# Patient Record
Sex: Female | Born: 1970 | Race: Black or African American | Hispanic: No | Smoking: Never smoker
Health system: Southern US, Community
[De-identification: ages and names within clinical notes are randomized; demographics above are authoritative.]

## PROBLEM LIST (undated history)

## (undated) DIAGNOSIS — F419 Anxiety disorder, unspecified: Secondary | ICD-10-CM

## (undated) DIAGNOSIS — N920 Excessive and frequent menstruation with regular cycle: Secondary | ICD-10-CM

## (undated) DIAGNOSIS — R319 Hematuria, unspecified: Secondary | ICD-10-CM

## (undated) DIAGNOSIS — I341 Nonrheumatic mitral (valve) prolapse: Secondary | ICD-10-CM

## (undated) DIAGNOSIS — R300 Dysuria: Secondary | ICD-10-CM

## (undated) DIAGNOSIS — B9689 Other specified bacterial agents as the cause of diseases classified elsewhere: Secondary | ICD-10-CM

## (undated) DIAGNOSIS — M329 Systemic lupus erythematosus, unspecified: Secondary | ICD-10-CM

## (undated) DIAGNOSIS — N63 Unspecified lump in unspecified breast: Secondary | ICD-10-CM

## (undated) DIAGNOSIS — N760 Acute vaginitis: Secondary | ICD-10-CM

## (undated) DIAGNOSIS — N644 Mastodynia: Secondary | ICD-10-CM

## (undated) DIAGNOSIS — N390 Urinary tract infection, site not specified: Secondary | ICD-10-CM

## (undated) DIAGNOSIS — R102 Pelvic and perineal pain: Secondary | ICD-10-CM

## (undated) DIAGNOSIS — K5900 Constipation, unspecified: Secondary | ICD-10-CM

## (undated) DIAGNOSIS — J189 Pneumonia, unspecified organism: Secondary | ICD-10-CM

## (undated) DIAGNOSIS — N930 Postcoital and contact bleeding: Secondary | ICD-10-CM

## (undated) DIAGNOSIS — N809 Endometriosis, unspecified: Secondary | ICD-10-CM

## (undated) DIAGNOSIS — K219 Gastro-esophageal reflux disease without esophagitis: Secondary | ICD-10-CM

## (undated) DIAGNOSIS — D649 Anemia, unspecified: Secondary | ICD-10-CM

## (undated) DIAGNOSIS — I7300 Raynaud's syndrome without gangrene: Secondary | ICD-10-CM

## (undated) DIAGNOSIS — IMO0002 Reserved for concepts with insufficient information to code with codable children: Secondary | ICD-10-CM

## (undated) DIAGNOSIS — F32A Depression, unspecified: Secondary | ICD-10-CM

## (undated) DIAGNOSIS — N840 Polyp of corpus uteri: Secondary | ICD-10-CM

## (undated) DIAGNOSIS — N83209 Unspecified ovarian cyst, unspecified side: Secondary | ICD-10-CM

## (undated) DIAGNOSIS — D219 Benign neoplasm of connective and other soft tissue, unspecified: Secondary | ICD-10-CM

## (undated) DIAGNOSIS — K625 Hemorrhage of anus and rectum: Secondary | ICD-10-CM

## (undated) DIAGNOSIS — M349 Systemic sclerosis, unspecified: Secondary | ICD-10-CM

## (undated) DIAGNOSIS — N926 Irregular menstruation, unspecified: Secondary | ICD-10-CM

## (undated) DIAGNOSIS — J849 Interstitial pulmonary disease, unspecified: Secondary | ICD-10-CM

## (undated) DIAGNOSIS — Z460 Encounter for fitting and adjustment of spectacles and contact lenses: Secondary | ICD-10-CM

## (undated) DIAGNOSIS — Z8489 Family history of other specified conditions: Secondary | ICD-10-CM

## (undated) DIAGNOSIS — I10 Essential (primary) hypertension: Secondary | ICD-10-CM

## (undated) DIAGNOSIS — M351 Other overlap syndromes: Secondary | ICD-10-CM

## (undated) HISTORY — DX: Dysuria: R30.0

## (undated) HISTORY — DX: Reserved for concepts with insufficient information to code with codable children: IMO0002

## (undated) HISTORY — DX: Systemic lupus erythematosus, unspecified: M32.9

## (undated) HISTORY — DX: Other specified bacterial agents as the cause of diseases classified elsewhere: N76.0

## (undated) HISTORY — DX: Polyp of corpus uteri: N84.0

## (undated) HISTORY — PX: FOOT BONE EXCISION: SUR493

## (undated) HISTORY — DX: Excessive and frequent menstruation with regular cycle: N92.0

## (undated) HISTORY — DX: Endometriosis, unspecified: N80.9

## (undated) HISTORY — DX: Benign neoplasm of connective and other soft tissue, unspecified: D21.9

## (undated) HISTORY — PX: STRABISMUS SURGERY: SHX218

## (undated) HISTORY — DX: Other specified bacterial agents as the cause of diseases classified elsewhere: B96.89

## (undated) HISTORY — DX: Unspecified ovarian cyst, unspecified side: N83.209

## (undated) HISTORY — DX: Postcoital and contact bleeding: N93.0

## (undated) HISTORY — DX: Hemorrhage of anus and rectum: K62.5

## (undated) HISTORY — DX: Urinary tract infection, site not specified: N39.0

## (undated) HISTORY — DX: Essential (primary) hypertension: I10

## (undated) HISTORY — DX: Pelvic and perineal pain: R10.2

## (undated) HISTORY — DX: Gastro-esophageal reflux disease without esophagitis: K21.9

## (undated) HISTORY — DX: Mastodynia: N64.4

## (undated) HISTORY — PX: TONSILECTOMY, ADENOIDECTOMY, BILATERAL MYRINGOTOMY AND TUBES: SHX2538

## (undated) HISTORY — DX: Constipation, unspecified: K59.00

## (undated) HISTORY — DX: Unspecified lump in unspecified breast: N63.0

## (undated) HISTORY — PX: BREAST BIOPSY: SHX20

## (undated) HISTORY — DX: Irregular menstruation, unspecified: N92.6

## (undated) HISTORY — DX: Hematuria, unspecified: R31.9

---
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
## 1998-10-27 ENCOUNTER — Other Ambulatory Visit: Admission: RE | Admit: 1998-10-27 | Discharge: 1998-10-27 | Payer: Self-pay | Admitting: *Deleted

## 1999-02-26 ENCOUNTER — Emergency Department (HOSPITAL_COMMUNITY): Admission: EM | Admit: 1999-02-26 | Discharge: 1999-02-26 | Payer: Self-pay | Admitting: Emergency Medicine

## 1999-10-03 ENCOUNTER — Other Ambulatory Visit: Admission: RE | Admit: 1999-10-03 | Discharge: 1999-10-03 | Payer: Self-pay | Admitting: *Deleted

## 1999-10-13 ENCOUNTER — Ambulatory Visit (HOSPITAL_COMMUNITY): Admission: RE | Admit: 1999-10-13 | Discharge: 1999-10-13 | Payer: Self-pay | Admitting: *Deleted

## 1999-10-13 ENCOUNTER — Encounter: Payer: Self-pay | Admitting: *Deleted

## 2000-10-02 ENCOUNTER — Other Ambulatory Visit: Admission: RE | Admit: 2000-10-02 | Discharge: 2000-10-02 | Payer: Self-pay | Admitting: Obstetrics and Gynecology

## 2000-10-03 ENCOUNTER — Encounter: Payer: Self-pay | Admitting: Obstetrics and Gynecology

## 2000-10-03 ENCOUNTER — Encounter: Admission: RE | Admit: 2000-10-03 | Discharge: 2000-10-03 | Payer: Self-pay | Admitting: Obstetrics and Gynecology

## 2001-09-18 HISTORY — PX: DIAGNOSTIC LAPAROSCOPY: SUR761

## 2001-10-07 ENCOUNTER — Other Ambulatory Visit: Admission: RE | Admit: 2001-10-07 | Discharge: 2001-10-07 | Payer: Self-pay | Admitting: Obstetrics and Gynecology

## 2002-05-09 ENCOUNTER — Encounter (INDEPENDENT_AMBULATORY_CARE_PROVIDER_SITE_OTHER): Payer: Self-pay

## 2002-05-09 ENCOUNTER — Ambulatory Visit (HOSPITAL_COMMUNITY): Admission: RE | Admit: 2002-05-09 | Discharge: 2002-05-09 | Payer: Self-pay | Admitting: Obstetrics and Gynecology

## 2002-10-08 ENCOUNTER — Other Ambulatory Visit: Admission: RE | Admit: 2002-10-08 | Discharge: 2002-10-08 | Payer: Self-pay | Admitting: Obstetrics and Gynecology

## 2002-11-17 ENCOUNTER — Encounter (INDEPENDENT_AMBULATORY_CARE_PROVIDER_SITE_OTHER): Payer: Self-pay | Admitting: Specialist

## 2002-11-17 ENCOUNTER — Ambulatory Visit (HOSPITAL_COMMUNITY): Admission: RE | Admit: 2002-11-17 | Discharge: 2002-11-17 | Payer: Self-pay | Admitting: Gastroenterology

## 2003-01-26 ENCOUNTER — Encounter: Payer: Self-pay | Admitting: Obstetrics and Gynecology

## 2003-01-26 ENCOUNTER — Encounter: Admission: RE | Admit: 2003-01-26 | Discharge: 2003-01-26 | Payer: Self-pay | Admitting: Obstetrics and Gynecology

## 2003-10-19 ENCOUNTER — Other Ambulatory Visit: Admission: RE | Admit: 2003-10-19 | Discharge: 2003-10-19 | Payer: Self-pay | Admitting: Obstetrics and Gynecology

## 2004-04-26 ENCOUNTER — Encounter: Admission: RE | Admit: 2004-04-26 | Discharge: 2004-04-26 | Payer: Self-pay | Admitting: Gastroenterology

## 2004-08-29 ENCOUNTER — Encounter: Admission: RE | Admit: 2004-08-29 | Discharge: 2004-08-29 | Payer: Self-pay | Admitting: Obstetrics and Gynecology

## 2004-11-29 ENCOUNTER — Other Ambulatory Visit: Admission: RE | Admit: 2004-11-29 | Discharge: 2004-11-29 | Payer: Self-pay | Admitting: Obstetrics and Gynecology

## 2005-09-20 ENCOUNTER — Encounter: Admission: RE | Admit: 2005-09-20 | Discharge: 2005-09-20 | Payer: Self-pay | Admitting: Obstetrics and Gynecology

## 2005-12-13 ENCOUNTER — Other Ambulatory Visit: Admission: RE | Admit: 2005-12-13 | Discharge: 2005-12-13 | Payer: Self-pay | Admitting: Obstetrics and Gynecology

## 2006-07-10 ENCOUNTER — Encounter: Admission: RE | Admit: 2006-07-10 | Discharge: 2006-07-10 | Payer: Self-pay | Admitting: Obstetrics and Gynecology

## 2006-10-02 ENCOUNTER — Encounter: Admission: RE | Admit: 2006-10-02 | Discharge: 2006-10-02 | Payer: Self-pay | Admitting: Obstetrics and Gynecology

## 2007-07-26 ENCOUNTER — Emergency Department (HOSPITAL_COMMUNITY): Admission: EM | Admit: 2007-07-26 | Discharge: 2007-07-26 | Payer: Self-pay | Admitting: Emergency Medicine

## 2007-10-14 ENCOUNTER — Encounter: Admission: RE | Admit: 2007-10-14 | Discharge: 2007-10-14 | Payer: Self-pay | Admitting: Obstetrics and Gynecology

## 2008-10-26 ENCOUNTER — Encounter: Admission: RE | Admit: 2008-10-26 | Discharge: 2008-10-26 | Payer: Self-pay | Admitting: Obstetrics and Gynecology

## 2008-12-24 ENCOUNTER — Emergency Department (HOSPITAL_BASED_OUTPATIENT_CLINIC_OR_DEPARTMENT_OTHER): Admission: EM | Admit: 2008-12-24 | Discharge: 2008-12-24 | Payer: Self-pay | Admitting: Emergency Medicine

## 2009-11-16 ENCOUNTER — Encounter: Admission: RE | Admit: 2009-11-16 | Discharge: 2009-11-16 | Payer: Self-pay | Admitting: Obstetrics and Gynecology

## 2009-12-01 ENCOUNTER — Encounter: Admission: RE | Admit: 2009-12-01 | Discharge: 2009-12-01 | Payer: Self-pay | Admitting: Obstetrics and Gynecology

## 2010-09-18 DIAGNOSIS — M329 Systemic lupus erythematosus, unspecified: Secondary | ICD-10-CM

## 2010-09-18 HISTORY — DX: Systemic lupus erythematosus, unspecified: M32.9

## 2010-10-09 ENCOUNTER — Encounter: Payer: Self-pay | Admitting: Obstetrics and Gynecology

## 2010-12-15 ENCOUNTER — Other Ambulatory Visit: Payer: Self-pay | Admitting: Obstetrics and Gynecology

## 2010-12-15 DIAGNOSIS — Z1231 Encounter for screening mammogram for malignant neoplasm of breast: Secondary | ICD-10-CM

## 2010-12-20 ENCOUNTER — Ambulatory Visit
Admission: RE | Admit: 2010-12-20 | Discharge: 2010-12-20 | Disposition: A | Discharge status: Home / Self Care | Payer: BC Managed Care – PPO | Source: Ambulatory Visit | Attending: Obstetrics and Gynecology | Admitting: Obstetrics and Gynecology

## 2010-12-20 DIAGNOSIS — Z1231 Encounter for screening mammogram for malignant neoplasm of breast: Secondary | ICD-10-CM

## 2011-02-03 NOTE — Op Note (Signed)
NAME:  Sherry Campos, Sherry Campos                ACCOUNT NO.:  [id number]   MEDICAL RECORD NO.:  [id number]                   PATIENT TYPE:  AMB   LOCATION:  SDC                                  FACILITY:  [facility]   PHYSICIAN:  Janine Limbo, M.D.            DATE OF BIRTH:  05/04/1971   DATE OF PROCEDURE:  05/09/2002  DATE OF DISCHARGE:                                 OPERATIVE REPORT   PREOPERATIVE DIAGNOSES:  1. Pelvic pain.  2. Dyspareunia.  3. Rectal pain.   POSTOPERATIVE DIAGNOSES:  1. Pelvic pain.  2. Dyspareunia.  3. Rectal pain.  4. Pelvic adhesive disease (severe).  5. Fibroid uterus.   PROCEDURE:  1. Diagnostic laparoscopy.  2. Laparoscopic lysis of adhesions.  3. Proctoscopy.   SURGEON:  Janine Limbo, M.D.   ANESTHESIA:  General.   INDICATIONS:  The patient is a 40 year old female, para 1-0-1-1, who  presents with the above mentioned diagnoses.  She understands the  indications for her procedure and she accepts the risks of, but not limited  to, anesthetic complications, bleeding, infection and the possible damage to  the surrounding organs.  She understands that no guarantees can be given  concerning the total relief of her discomfort.   FINDINGS:  The uterus was normal size.  There was a 1 cm fibroid present on  the posterior fundus of the uterus.  There were dense adhesions present in  the posterior cul-de-sac that practically obliterated the space.  There were  dense adhesions between the ovaries, the posterior cul-de-sac and the bowel.  Surprisingly, the ovaries were normal except for the adhesions.  The  fallopian tubes were involved in the adhesions.  The fimbriated ends of the  fallopian tubes were delicate nevertheless.  There was no evidence of  endometriosis in the posterior cul-de-sac, anterior cul-de-sac, along the  bowel or along the pelvic sidewalls.  There were two to three inclusion  cysts with relatively thick walls present in the  right posterior cul-de-sac.  The omentum was adhered to the anterior abdominal wall in the lower pelvis.  There was no evidence of damage to the bowel when we performed the  proctoscopy.  The appendix, the liver and the upper abdomen appeared normal.   DESCRIPTION OF PROCEDURE:  The patient was taken to the operating room where  a general anesthetic was given.  The patient's abdomen, perineum and vagina  were prepped with multiple layers of Betadine.  Examination under anesthesia  was performed.  A Foley catheter was placed in the bladder.  A Hulka  tenaculum was placed inside the uterus.  The patient was sterilely draped.  The subumbilical area was injected with 4 cc of 0.25% Marcaine with  epinephrine.  A subumbilical incision was made, and the Veress needle was  inserted without difficulty.  Proper placement was confirmed using the  saline drop test.  A pneumoperitoneum was obtained.  The laparoscopic trocar  and then the laparoscope were substituted  for the Veress needle.  The pelvic  structures were visualized with findings as mentioned above.  Pictures were  taken of the patient's pelvic anatomy.  Two suprapubic areas were injected  with a total of 6 cc of 0.25% Marcaine with epinephrine.  Two incisions were  made, and two 5 mm trocars were placed in the lower abdomen under direct  visualization.  The adhesions between the omentum and the anterior abdominal  wall was then cauterized and then cut.  The adhesions in the posterior cul-  de-sac were then lysed using a combination of sharp dissection, blunt  dissection, and hydrodissection.  This took several hours because of the  dense nature of the adhesions.  We were able to isolate the ovaries  bilaterally, and the adhesions were lysed as we mentioned previously.  Two  to three inclusion cysts were noted in the right posterior cul-de-sac, and  these were removed using sharp dissection and blunt dissection.  Care was  taken not to  damage the bowel and not to damage the vital structures of the  pelvic sidewall.  Once the adhesions were completely lysed as best we could,  then the pelvis was vigorously irrigated.  Hemostasis was noted to be  adequate.  Saline was placed in the cul-de-sac.  Proctoscopy was then  performed.   Air was placed in the rectum and the lower bowel, and there was no evidence  of leakage.  The air was allowed to escape from the lower bowel and the  rectum.  The operator then changed the scrub outfit.  A final check was made  in the pelvis for hemostasis.  Hemostasis again was noted to be adequate.  The bowel was carefully inspected, and there was no evidence of damage from  our trocars.  Excess fluid was aspirated from the pelvis.  The instruments  were removed.  The incisions were closed after the gas was allowed to  escape.  Suture material used was 4-0 Vicryl.  Sponge, needle and instrument  counts were correct on two occasions.  The estimated blood loss was 20 cc.  The patient tolerated her procedure well.  The patient was awakened from her  anesthetic and taken to the recovery room in stable condition.  The  specimens were sent to pathology.  Pictures were taken of the patient's  pelvic and abdominal anatomy.   FOLLOW UP INSTRUCTIONS:  The patient will return to see Dr. Stefano Gaul in two  to three weeks for followup examination.  She was given a copy of the  postoperative instruction sheet as prepared by the [facility] of  [facility] for patients who have undergone laparoscopy.  She was given a  prescription for Tylenol No. 3, and she will taken one to two tablets every  four hours as needed for pain.                                                Janine Limbo, M.D.    AVS/MEDQ  D:  05/09/2002  T:  05/10/2002  Job:  [id number]
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
## 2011-02-03 NOTE — Op Note (Signed)
NAME:  Sherry Campos, Sherry Campos                ACCOUNT NO.:  [id number]   MEDICAL RECORD NO.:  [id number]                   PATIENT TYPE:  AMB   LOCATION:  ENDO                                 FACILITY:  [facility]   PHYSICIAN:  Anselmo Rod, M.D.               DATE OF BIRTH:  1970/09/24   DATE OF PROCEDURE:  11/17/2002  DATE OF DISCHARGE:                                 OPERATIVE REPORT   PROCEDURE:  Colonoscopy with biopsies.   ENDOSCOPIST:  Anselmo Rod, M.D.   INSTRUMENT USED:  Olympus video colonoscope.   INDICATIONS FOR PROCEDURE:  This is a 40 year old African-American female  with a history of rectal bleeding, a family history of colon cancer in an  uncle, and breast cancer in her mother.  Rule out colonic polyps, masses,  etc.  Rectal bleeding.   PRE-PROCEDURE PREPARATION:  An informed consent was procured from the  patient and the patient was fasted for eight hours prior to the procedure  and prepped with a bottle of magnesium citrate and a gallon of GoLYTELY on  the night prior to the procedure.   PRE-PROCEDURE PHYSICAL EXAM:  VITAL SIGNS:  Stable.  NECK:  Supple.  CHEST:  Clear to auscultation.  HEART:  S1, S2, regular.  ABDOMEN:  Soft with normal bowel sounds.   DESCRIPTION OF PROCEDURE:  The patient was placed in the left lateral  decubitus position and sedated with 100 mg of Demerol and 10 mg of Versed  intravenously.  Once the patient was adequately sedated and maintained on  low-flow oxygen and continuous cardiac monitoring, the Olympus video  colonoscope was advanced from the rectum to the cecum and terminal ileum  without difficulty.  The patient had a fairly good prep.  There were small  external hemorrhoids on anal inspection.  A small area of erythematous  nodular lesion was seen at 18 cm.  This was biopsied for pathology to rule  out dysplasia versus adenoma.  The rest of the colon beyond this point, up  to the terminal ileum appeared normal.    IMPRESSION:  1. Small external hemorrhoids.  2. Small nodular lesion, biopsied at 18 cm.  Rule out  dysplasia versus     adenoma.  3. There was normal-appearing proximal left colon, right colon, transverse     colon, cecum and terminal ileum.    RECOMMENDATIONS:  1. Await the pathology results.  2. Outpatient followup in the next two weeks for recommendations.                                               Anselmo Rod, M.D.    JNM/MEDQ  D:  11/17/2002  T:  11/17/2002  Job:  [id number]   cc:   Janine Limbo, M.D.  [address], [address]  [place]  [place] [address]  Fax: [phone]

## 2011-06-27 LAB — DIFFERENTIAL
Basophils Relative: 0
Eosinophils Absolute: 0.1
Monocytes Relative: 6
Neutro Abs: 8.1 — ABNORMAL HIGH
Neutrophils Relative %: 80 — ABNORMAL HIGH

## 2011-06-27 LAB — COMPREHENSIVE METABOLIC PANEL
ALT: 12
AST: 20
BUN: 8
Creatinine, Ser: 0.63
Glucose, Bld: 122 — ABNORMAL HIGH
Potassium: 3.9
Sodium: 135

## 2011-06-27 LAB — URINALYSIS, ROUTINE W REFLEX MICROSCOPIC: Urobilinogen, UA: 0.2

## 2011-06-27 LAB — CBC: HCT: 32.7 — ABNORMAL LOW

## 2011-06-27 LAB — URINE MICROSCOPIC-ADD ON

## 2011-07-17 ENCOUNTER — Other Ambulatory Visit: Payer: Self-pay | Admitting: Obstetrics and Gynecology

## 2011-07-17 DIAGNOSIS — N644 Mastodynia: Secondary | ICD-10-CM

## 2011-07-17 DIAGNOSIS — N63 Unspecified lump in unspecified breast: Secondary | ICD-10-CM

## 2011-07-27 ENCOUNTER — Ambulatory Visit
Admission: RE | Admit: 2011-07-27 | Discharge: 2011-07-27 | Disposition: A | Discharge status: Home / Self Care | Payer: BC Managed Care – PPO | Source: Ambulatory Visit | Attending: Obstetrics and Gynecology | Admitting: Obstetrics and Gynecology

## 2011-07-27 DIAGNOSIS — N63 Unspecified lump in unspecified breast: Secondary | ICD-10-CM

## 2011-07-27 DIAGNOSIS — N644 Mastodynia: Secondary | ICD-10-CM

## 2012-05-09 ENCOUNTER — Ambulatory Visit: Payer: Self-pay | Admitting: Obstetrics and Gynecology

## 2012-06-03 ENCOUNTER — Ambulatory Visit: Payer: Self-pay | Admitting: Obstetrics and Gynecology

## 2012-06-26 ENCOUNTER — Ambulatory Visit: Payer: BC Managed Care – PPO | Admitting: Obstetrics and Gynecology

## 2012-07-04 ENCOUNTER — Ambulatory Visit (INDEPENDENT_AMBULATORY_CARE_PROVIDER_SITE_OTHER): Payer: BC Managed Care – PPO | Admitting: Obstetrics and Gynecology

## 2012-07-04 ENCOUNTER — Encounter: Payer: Self-pay | Admitting: Obstetrics and Gynecology

## 2012-07-04 VITALS — BP 110/70 | HR 80 | Resp 16 | Ht 65.5 in | Wt 176.0 lb

## 2012-07-04 DIAGNOSIS — Z01419 Encounter for gynecological examination (general) (routine) without abnormal findings: Secondary | ICD-10-CM

## 2012-07-04 DIAGNOSIS — D259 Leiomyoma of uterus, unspecified: Secondary | ICD-10-CM

## 2012-07-04 DIAGNOSIS — N6314 Unspecified lump in the right breast, lower inner quadrant: Secondary | ICD-10-CM

## 2012-07-04 DIAGNOSIS — Z124 Encounter for screening for malignant neoplasm of cervix: Secondary | ICD-10-CM

## 2012-07-04 DIAGNOSIS — M329 Systemic lupus erythematosus, unspecified: Secondary | ICD-10-CM

## 2012-07-04 DIAGNOSIS — N63 Unspecified lump in unspecified breast: Secondary | ICD-10-CM

## 2012-07-04 DIAGNOSIS — D219 Benign neoplasm of connective and other soft tissue, unspecified: Secondary | ICD-10-CM

## 2012-07-04 NOTE — Progress Notes (Signed)
Subjective:    Sherry Campos is a 41 y.o. female, G2P1, who presents for an annual exam. The patient reports new diagnosis of Lupus last fall and evaluation of right breast nodule (U/S and diagnostic MG) in early spring. States the SLE has been responsive, thus far to treatment and her breast nodule changes sizes and tenderness depending on phase of her menstrual cycle.  Menstrual cycle:   LMP: Patient's last menstrual period was 06/21/2012. Flow 6 days with pad change every 3-4 hours, minimal cramps  PMH: new diagnosis-SLE (Rheumatologist-Dr. Christen Bame,  [facility]            Review of Systems Pertinent items are noted in HPI. Denies pelvic pain, urinary tract symptoms, vaginitis symptoms, irregular bleeding, menopausal symptoms, or rectal bleeding Patient does admit to constipation Objective:    BP 110/70  Pulse 80  Resp 16  Ht 5' 5.5" (1.664 m)  Wt 176 lb (79.833 kg)  BMI 28.84 kg/m2  LMP 06/21/2012    Wt Readings from Last 1 Encounters:  07/04/12 176 lb (79.833 kg)   Body mass index is 28.84 kg/(m^2). General Appearance: Alert, no acute distress HEENT: Grossly normal Neck / Thyroid: Supple, no thyromegaly or cervical adenopathy Lungs: Clear to auscultation bilaterally Back: No CVA tenderness Breast Exam: Right breast with 1.5 cm cystic, slightly tender, slightly mobile nodule at 5 o'clock of areola, No dimpling, nipple retraction or discharge. Cardiovascular: Regular rate and rhythm.  Gastrointestinal: Soft, non-tender, no masses or organomegaly Pelvic Exam: EGBUS-wnl, vagina-normal rugae, cervix- without lesions or tenderness, uterus appears 12-14 weeks size, adnexae-no masses or tenderness Rectovaginal: no masses and normal sphincter tone Lymphatic Exam: Non-palpable nodes in neck, clavicular,  axillary, or inguinal regions  Skin: no rashes or abnormalities Extremities: no clubbing cyanosis or edema  Neurologic: grossly normal Psychiatric: Alert and  oriented   Assessment:   Routine GYN Exam SLE  Family History (breast cancer-mother/maternal aunts) Right Breast Nodule x 9 months Fibroids (stable) Constipation    Plan:  Offered general surgery consultation for breast nodule (has seen a general surgeon with no recommendations for excision) Offered genetic counseling-declines for now  Bowel Hygiene,  increase liquids, stool softeners and/or Miralax if laxative is needed PAP sent  RTO 1 year or prn  Kiowa Hollar,ELMIRAPA-C

## 2012-07-04 NOTE — Progress Notes (Signed)
Regular Periods: yes Mammogram: yes  Monthly Breast Ex.: yes Exercise: yes  Tetanus < 10 years: yes Seatbelts: yes  NI. Bladder Functn.: yes Abuse at home: no  Daily BM's: no Constipated Stressful Work: yes  Healthy Diet: yes Sigmoid-Colonoscopy: "2005" WNL  Calcium: no Medical problems this year: Dx w/ Lupus in jan. 2013   LAST PAP:02/17/2009  Contraception: VAS  Mammogram:  11/2011 Toma Aran  PCP: Cam Hai, MD   PMH: recently Diagnosed w/ Lupus  FMH: No changes  Last Bone Scan: Never

## 2012-07-05 LAB — PAP IG W/ RFLX HPV ASCU

## 2012-07-31 ENCOUNTER — Telehealth: Payer: Self-pay | Admitting: Obstetrics and Gynecology

## 2012-07-31 DIAGNOSIS — N63 Unspecified lump in unspecified breast: Secondary | ICD-10-CM

## 2012-07-31 NOTE — Telephone Encounter (Signed)
Tc to pt per EP recs. Pt agrees. Appt sched 08/19/12 @9 :20 with Dr. Dwain Sarna @ [facility]. Pt voices understanding.

## 2012-07-31 NOTE — Telephone Encounter (Signed)
EP to address

## 2012-07-31 NOTE — Telephone Encounter (Signed)
Patient with a persistent right breast nodule at 5 o'clock position (evaluated in early spring with a diagnostic MG and U/S), now wants further evaluation.  Patient will need to be referred to a general surgeon for further evaluation. Steffanie Mingle, PA-C

## 2012-08-14 ENCOUNTER — Encounter (INDEPENDENT_AMBULATORY_CARE_PROVIDER_SITE_OTHER): Payer: Self-pay | Admitting: General Surgery

## 2012-08-19 ENCOUNTER — Encounter (INDEPENDENT_AMBULATORY_CARE_PROVIDER_SITE_OTHER): Payer: Self-pay | Admitting: General Surgery

## 2012-08-19 ENCOUNTER — Ambulatory Visit (INDEPENDENT_AMBULATORY_CARE_PROVIDER_SITE_OTHER): Payer: BC Managed Care – PPO | Admitting: General Surgery

## 2012-08-19 VITALS — BP 156/98 | HR 72 | Temp 98.5°F | Resp 14 | Ht 65.5 in | Wt 171.6 lb

## 2012-08-19 DIAGNOSIS — N63 Unspecified lump in unspecified breast: Secondary | ICD-10-CM

## 2012-08-19 NOTE — Patient Instructions (Signed)
[facility] °Office Phone Number [phone] ° °BREAST BIOPSY/ PARTIAL MASTECTOMY: POST OP INSTRUCTIONS ° °Always review your discharge instruction sheet given to you by the facility where your surgery was performed. ° °IF YOU HAVE DISABILITY OR FAMILY LEAVE FORMS, YOU MUST BRING THEM TO THE OFFICE FOR PROCESSING.  DO NOT GIVE THEM TO YOUR DOCTOR. ° °1. A prescription for pain medication may be given to you upon discharge.  Take your pain medication as prescribed, if needed.  If narcotic pain medicine is not needed, then you may take acetaminophen (Tylenol), naprosyn (Alleve) or ibuprofen (Advil) as needed. °2. Take your usually prescribed medications unless otherwise directed °3. If you need a refill on your pain medication, please contact your pharmacy.  They will contact our office to request authorization.  Prescriptions will not be filled after 5pm or on week-ends. °4. You should eat very light the first 24 hours after surgery, such as soup, crackers, pudding, etc.  Resume your normal diet the day after surgery. °5. Most patients will experience some swelling and bruising in the breast.  Ice packs and a good support bra will help.  Wear the breast binder provided or a sports bra for 72 hours day and night.  After that wear a sports bra during the day until you return to the office. Swelling and bruising can take several days to resolve.  °6. It is common to experience some constipation if taking pain medication after surgery.  Increasing fluid intake and taking a stool softener will usually help or prevent this problem from occurring.  A mild laxative (Milk of Magnesia or Miralax) should be taken according to package directions if there are no bowel movements after 48 hours. °7. Unless discharge instructions indicate otherwise, you may remove your bandages 48 hours after surgery and you may shower at that time.  You may have steri-strips (small skin tapes) in place directly over the incision.   These strips should be left on the skin for 7-10 days and will come off on their own.  If your surgeon used skin glue on the incision, you may shower in 24 hours.  The glue will flake off over the next 2-3 weeks.  Any sutures or staples will be removed at the office during your follow-up visit. °8. ACTIVITIES:  You may resume regular daily activities (gradually increasing) beginning the next day.  Wearing a good support bra or sports bra minimizes pain and swelling.  You may have sexual intercourse when it is comfortable. °a. You may drive when you no longer are taking prescription pain medication, you can comfortably wear a seatbelt, and you can safely maneuver your car and apply brakes. °b. RETURN TO WORK:  ______________________________________________________________________________________ °9. You should see your doctor in the office for a follow-up appointment approximately two weeks after your surgery.  Your doctor’s nurse will typically make your follow-up appointment when she calls you with your pathology report.  Expect your pathology report 3-4 business days after your surgery.  You may call to check if you do not hear from us after three days. °10. OTHER INSTRUCTIONS: _______________________________________________________________________________________________ _____________________________________________________________________________________________________________________________________ °_____________________________________________________________________________________________________________________________________ °_____________________________________________________________________________________________________________________________________ ° °WHEN TO CALL DR Monque Haggar: °1. Fever over 101.0 °2. Nausea and/or vomiting. °3. Extreme swelling or bruising. °4. Continued bleeding from incision. °5. Increased pain, redness, or drainage from the incision. ° °The clinic staff is available to  answer your questions during regular business hours.  Please don’t hesitate to call and ask to speak to one of the nurses for   clinical concerns.  If you have a medical emergency, go to the nearest emergency room or call 911.  A surgeon from [facility] is always on call at the hospital. ° °For further questions, please visit [email or web address] ° °

## 2012-08-19 NOTE — Progress Notes (Signed)
Patient ID: Sherry Campos, female   DOB: 1971/09/03, 41 y.o.   MRN: [id number]  Chief Complaint  Patient presents with  . Breast Problem    new pt- eval br nodule    HPI Sherry Campos is a 41 y.o. female.  Referred by Ms Lowell Guitar HPI This is a 40 year old female who was noted on her routine gynecologic exam to have a right breast nodule. She has an ultrasound and a mammogram that I have the reports of that were completed in the spring that are normal. She can identify this area notes no real change in size. She has no nipple discharge. She notes no other symptoms associated with this. She previously been seen by a surgeon in [facility] recommended not proceeding with anything else. This area has persisted and she comes in today for evaluation. She does have a significant family history and her mom and her aunts both in their 48s and 28s with breast cancer. She says that a couple of them may have received genetic testing and this was negative. She has also recently been diagnosed with lupus and she's been treated by rheumatology at [facility] and this has improved somewhat. Past Medical History  Diagnosis Date  . Breast tenderness   . Menorrhagia   . Endometrial polyp   . Hematuria   . Fibroids   . Simple ovarian cyst   . Irregular menstrual bleeding   . Post - coital bleeding   . UTI (urinary tract infection)   . Endometriosis   . BV (bacterial vaginosis)   . Pelvic pain in female   . Dysuria   . Rectal bleeding   . Constipation   . Breast nodule   . SLE (systemic lupus erythematosus) 2012  . GERD (gastroesophageal reflux disease)   . Hypertension   . Lupus     Past Surgical History  Procedure Date  . Cesarean section   . Diagnostic laparoscopy 2003  . Tonsilectomy, adenoidectomy, bilateral myringotomy and tubes     Family History  Problem Relation Age of Onset  . Cancer Mother     breast  . Diabetes Mother   . Stroke Mother   . Hypertension  Mother   . Breast cancer Mother 73  . Diabetes Father   . Heart attack Father   . Hypertension Father   . Breast cancer Maternal Aunt 37  . Cancer Maternal Aunt     breast and lung  . Cancer Maternal Uncle     prostate  . Cancer Paternal Uncle     prostate    Social History History  Substance Use Topics  . Smoking status: Never Smoker   . Smokeless tobacco: Never Used  . Alcohol Use: No    Allergies  Allergen Reactions  . Sulfa Antibiotics     Current Outpatient Prescriptions  Medication Sig Dispense Refill  . hydroxychloroquine (PLAQUENIL) 200 MG tablet Take by mouth daily.      . meloxicam (MOBIC) 7.5 MG tablet Ad lib.      . metoprolol tartrate (LOPRESSOR) 25 MG tablet Take 25 mg by mouth 2 (two) times daily.      . mycophenolate (CELLCEPT) 500 MG tablet Take by mouth 2 (two) times daily.      Marland Kitchen omeprazole (PRILOSEC) 40 MG capsule daily.      Marland Kitchen spironolactone (ALDACTONE) 25 MG tablet Take 25 mg by mouth daily.        Review of Systems Review of Systems  Constitutional: Negative  for fever, chills and unexpected weight change.  HENT: Negative for hearing loss, congestion, sore throat, trouble swallowing and voice change.   Eyes: Negative for visual disturbance.  Respiratory: Negative for cough and wheezing.   Cardiovascular: Positive for palpitations. Negative for chest pain and leg swelling.  Gastrointestinal: Negative for nausea, vomiting, abdominal pain, diarrhea, constipation, blood in stool, abdominal distention and anal bleeding.  Genitourinary: Negative for hematuria, vaginal bleeding and difficulty urinating.  Musculoskeletal: Positive for arthralgias.  Skin: Negative for rash and wound.  Neurological: Positive for weakness. Negative for seizures, syncope and headaches.  Hematological: Negative for adenopathy. Does not bruise/bleed easily.  Psychiatric/Behavioral: Negative for confusion.    Blood pressure 156/98, pulse 72, temperature 98.5 F (36.9 C),  temperature source Temporal, resp. rate 14, height 5' 5.5" (1.664 m), weight 171 lb 9.6 oz (77.837 kg).  Physical Exam Physical Exam  Vitals reviewed. Constitutional: She appears well-developed and well-nourished.  Neck: Neck supple.  Cardiovascular: Normal rate, regular rhythm and normal heart sounds.   Pulmonary/Chest: Effort normal and breath sounds normal. She has no wheezes. She has no rales. Right breast exhibits mass.    Lymphadenopathy:    She has no cervical adenopathy.    Data Reviewed U/s mmg from [facility]  Assessment    Right breast mass    Plan    This area is fairly evident on physical examination and she and I can identify easily. I have her ultrasound and mammogram report from earlier this year at that does not show anything at all. It shows normal fibroglandular density with no evidence of mass or abnormal echogenicity. This was recommended for management on a clinical basis at that point. She was seen by a surgeon in [facility] who recommended just following this area. After reviewing her family history and her exam today I think to rule out a carcinoma that this area should should be removed. We discussed possible biopsy this is not really able to be found by imaging that we are going to proceed with an excisional biopsy she is agreeable to that. I discussed the indications to rule out cancer. I discussed an excisional biopsy and the risks and benefits associated with that. We discussed there is a slightly increased risk of infection given her lupus as well as her medications.    We also discussed genetic testing in the future as well as referral over the cancer center due to the fact that she is at high risk.   Robben Jagiello 08/19/2012, 10:13 AM

## 2012-08-22 ENCOUNTER — Telehealth (INDEPENDENT_AMBULATORY_CARE_PROVIDER_SITE_OTHER): Payer: Self-pay | Admitting: General Surgery

## 2012-08-22 NOTE — Telephone Encounter (Signed)
Returned pt's call and she had a few questions.  She wanted to ask Dr. Dwain Sarna if the size of the mass is big enough to make her not uniform after surgery with the other side.  She also wanted to know how long this procedure would take and she explained that she has met her deductible for this year and would like to do it before the end of the year but is leaving to go out of town on 12/25.  She wanted to know if there is a safe date that we could do this surgery before she leaves for out of town and still be all right considering she also has lupus.  I explained that I would forward this message to Dr. Dwain Sarna and that I will call her back once I have spoken to him about her case.

## 2012-08-26 ENCOUNTER — Other Ambulatory Visit (INDEPENDENT_AMBULATORY_CARE_PROVIDER_SITE_OTHER): Payer: Self-pay | Admitting: General Surgery

## 2012-08-26 NOTE — Telephone Encounter (Signed)
Kendall, I think both sides will still match.  The procedure will be less than an hour.  I think she needs to be around for 10 days postop to make sure all ok.  If she wants to try and do this we can but I don't want to do just prior to her leaving town.

## 2012-08-26 NOTE — Telephone Encounter (Addendum)
Spoke with patient and made her aware of Dr Doreen Salvage recommendations. She would like to go ahead and speak with our scheduling department about a date and if she needs to wait until January- she is okay with that. Dr Dwain Sarna - please write orders.

## 2012-10-09 ENCOUNTER — Telehealth (INDEPENDENT_AMBULATORY_CARE_PROVIDER_SITE_OTHER): Payer: Self-pay

## 2012-10-09 NOTE — Telephone Encounter (Signed)
Called pt to check on her BP status to see if we could get her r/s for surgery again to have breast mass removed. The pt is still working on the BP issue and she is seeing her PCP in a couple of weeks. The pt was put on different BP meds so they are trying to get it balanced. The pt will call me after her visit with the PCP to let me know what we need to do with getting her rescheduled.

## 2012-10-15 ENCOUNTER — Encounter (INDEPENDENT_AMBULATORY_CARE_PROVIDER_SITE_OTHER): Payer: Self-pay

## 2012-11-11 ENCOUNTER — Telehealth (INDEPENDENT_AMBULATORY_CARE_PROVIDER_SITE_OTHER): Payer: Self-pay

## 2012-11-11 NOTE — Telephone Encounter (Signed)
Pt returned my call. I made her a f/u appt with Dr Dwain Sarna for 11/25/12 but we will go ahead and work on getting pt scheduled for surgery. The pt understands.

## 2012-11-11 NOTE — Telephone Encounter (Signed)
LMOM for pt to call me so we can discuss getting her surgery r/s and making an office visit for pt to see Dr Dwain Sarna again.

## 2012-11-13 ENCOUNTER — Telehealth (INDEPENDENT_AMBULATORY_CARE_PROVIDER_SITE_OTHER): Payer: Self-pay

## 2012-11-13 NOTE — Telephone Encounter (Signed)
LM for nurse practioner Tilman Neat to fax note about pt's blood pressure being stable now so pt can have surgery in the next few weeks with Dr Dwain Sarna.

## 2012-11-25 ENCOUNTER — Encounter (INDEPENDENT_AMBULATORY_CARE_PROVIDER_SITE_OTHER): Payer: Self-pay | Admitting: General Surgery

## 2012-11-25 ENCOUNTER — Ambulatory Visit (INDEPENDENT_AMBULATORY_CARE_PROVIDER_SITE_OTHER): Payer: BC Managed Care – PPO | Admitting: General Surgery

## 2012-11-25 VITALS — BP 140/70 | HR 80 | Temp 98.9°F | Resp 18 | Ht 65.5 in | Wt 172.4 lb

## 2012-11-25 DIAGNOSIS — N63 Unspecified lump in unspecified breast: Secondary | ICD-10-CM

## 2012-11-25 DIAGNOSIS — N6314 Unspecified lump in the right breast, lower inner quadrant: Secondary | ICD-10-CM

## 2012-11-25 NOTE — Progress Notes (Signed)
Patient ID: Sherry Campos, female   DOB: 25-Aug-1971, 42 y.o.   MRN: [id number]  Chief Complaint  Patient presents with  . Routine Post Op    reck br mass    HPI Sherry Campos is a 42 y.o. female.   HPI 79 yof with sle who I saw in December with a right breast mass that was not seen on mm/us.  She has very significant family history.  This area has not been changed since our last visit.  She is due to get her mm now.  She has recently been treated for bronchitis and is on a prednisone taper for lupus flare.  She returns today to discuss planning excision of this area.  Past Medical History  Diagnosis Date  . Breast tenderness   . Menorrhagia   . Endometrial polyp   . Hematuria   . Fibroids   . Simple ovarian cyst   . Irregular menstrual bleeding   . Post - coital bleeding   . UTI (urinary tract infection)   . Endometriosis   . BV (bacterial vaginosis)   . Pelvic pain in female   . Dysuria   . Rectal bleeding   . Constipation   . Breast nodule   . SLE (systemic lupus erythematosus) 2012  . GERD (gastroesophageal reflux disease)   . Hypertension   . Lupus     Past Surgical History  Procedure Laterality Date  . Cesarean section    . Diagnostic laparoscopy  2003  . Tonsilectomy, adenoidectomy, bilateral myringotomy and tubes      Family History  Problem Relation Age of Onset  . Cancer Mother     breast  . Diabetes Mother   . Stroke Mother   . Hypertension Mother   . Breast cancer Mother 45  . Diabetes Father   . Heart attack Father   . Hypertension Father   . Breast cancer Maternal Aunt 37  . Cancer Maternal Aunt     breast and lung  . Cancer Maternal Uncle     prostate  . Cancer Paternal Uncle     prostate    Social History History  Substance Use Topics  . Smoking status: Never Smoker   . Smokeless tobacco: Never Used  . Alcohol Use: No    Allergies  Allergen Reactions  . Sulfa Antibiotics     Current Outpatient Prescriptions   Medication Sig Dispense Refill  . hydroxychloroquine (PLAQUENIL) 200 MG tablet Take by mouth daily.      . isosorbide mononitrate (IMDUR) 30 MG 24 hr tablet       . meloxicam (MOBIC) 7.5 MG tablet Ad lib.      . metoprolol tartrate (LOPRESSOR) 25 MG tablet Take 25 mg by mouth 2 (two) times daily.      Marland Kitchen omeprazole (PRILOSEC) 40 MG capsule daily.      Marland Kitchen spironolactone (ALDACTONE) 25 MG tablet Take 25 mg by mouth daily.      . mycophenolate (CELLCEPT) 500 MG tablet Take by mouth 2 (two) times daily.       No current facility-administered medications for this visit.    Review of Systems Review of Systems  Constitutional: Negative for fever, chills and unexpected weight change.  HENT: Negative for hearing loss, congestion, sore throat, trouble swallowing and voice change.   Eyes: Negative for visual disturbance.  Respiratory: Negative for cough and wheezing.   Cardiovascular: Positive for palpitations. Negative for chest pain and leg swelling.  Gastrointestinal: Negative for  nausea, vomiting, abdominal pain, diarrhea, constipation, blood in stool, abdominal distention and anal bleeding.  Genitourinary: Negative for hematuria, vaginal bleeding and difficulty urinating.  Musculoskeletal: Positive for arthralgias.  Skin: Negative for rash and wound.  Neurological: Positive for weakness. Negative for seizures, syncope and headaches.  Hematological: Negative for adenopathy. Does not bruise/bleed easily.  Psychiatric/Behavioral: Negative for confusion.    Blood pressure 140/70, pulse 80, temperature 98.9 F (37.2 C), temperature source Temporal, resp. rate 18, height 5' 5.5" (1.664 m), weight 172 lb 6.4 oz (78.2 kg).  Physical Exam Physical Exam  Vitals reviewed. Constitutional: She appears well-developed and well-nourished.  Neck: Neck supple.  Cardiovascular: Normal rate, regular rhythm and normal heart sounds.   Pulmonary/Chest: Effort normal and breath sounds normal. She has no  wheezes. She has no rales. Right breast exhibits mass. Right breast exhibits no inverted nipple, no nipple discharge, no skin change and no tenderness. Left breast exhibits no inverted nipple, no mass, no nipple discharge, no skin change and no tenderness. Breasts are symmetrical.    Lymphadenopathy:    She has no cervical adenopathy.    She has no axillary adenopathy.       Right: No supraclavicular adenopathy present.       Left: No supraclavicular adenopathy present.    Data Reviewed Prior mmg/us reports, my prior note  Assessment    Right breast mass, family history of breast cancer    Plan    This area has not really changed since our last visit.  It was not apparent on mmg/us before but we both can identify it easily on exam.  She is due to get mmg now and I told her I would like her to have this done prior to excising this area.  She will get this done and have results sent to me.  I still recommended right breast mass excision.  We discussed risks including further surgery if this is a cancer, bleeding, infection.       WAKEFIELD,MATTHEW 11/25/2012, 9:31 PM

## 2012-11-27 ENCOUNTER — Telehealth (INDEPENDENT_AMBULATORY_CARE_PROVIDER_SITE_OTHER): Payer: Self-pay

## 2012-11-27 DIAGNOSIS — N6310 Unspecified lump in the right breast, unspecified quadrant: Secondary | ICD-10-CM

## 2012-11-27 NOTE — Telephone Encounter (Signed)
Called pt to notify her with the diagnostic mgm appt with [facility] on 11/29/12 arrive at 8:15am for 8:30. The pt understands.

## 2012-11-29 ENCOUNTER — Encounter (HOSPITAL_BASED_OUTPATIENT_CLINIC_OR_DEPARTMENT_OTHER): Payer: Self-pay | Admitting: *Deleted

## 2012-11-29 ENCOUNTER — Ambulatory Visit
Admission: RE | Admit: 2012-11-29 | Discharge: 2012-11-29 | Disposition: A | Discharge status: Home / Self Care | Payer: BC Managed Care – PPO | Source: Ambulatory Visit | Attending: General Surgery | Admitting: General Surgery

## 2012-11-29 ENCOUNTER — Telehealth (INDEPENDENT_AMBULATORY_CARE_PROVIDER_SITE_OTHER): Payer: Self-pay

## 2012-11-29 DIAGNOSIS — N6310 Unspecified lump in the right breast, unspecified quadrant: Secondary | ICD-10-CM

## 2012-11-29 NOTE — Telephone Encounter (Signed)
Called pt to notify her that her mgm is normal per Dr Dwain Sarna.

## 2012-11-29 NOTE — Progress Notes (Signed)
Pt has lupus with recent flare of weakness-has had recent labwork and ekg at [facility]-called for records No cardiac or resp problems. Did have bronchitis ,but no inhalers.

## 2012-12-04 ENCOUNTER — Other Ambulatory Visit (INDEPENDENT_AMBULATORY_CARE_PROVIDER_SITE_OTHER): Payer: Self-pay | Admitting: General Surgery

## 2012-12-05 ENCOUNTER — Encounter (HOSPITAL_BASED_OUTPATIENT_CLINIC_OR_DEPARTMENT_OTHER): Payer: Self-pay | Admitting: Anesthesiology

## 2012-12-05 ENCOUNTER — Ambulatory Visit (HOSPITAL_BASED_OUTPATIENT_CLINIC_OR_DEPARTMENT_OTHER)
Admission: RE | Admit: 2012-12-05 | Discharge: 2012-12-05 | Disposition: A | Discharge status: Home / Self Care | Payer: BC Managed Care – PPO | Source: Ambulatory Visit | Attending: General Surgery | Admitting: General Surgery

## 2012-12-05 ENCOUNTER — Encounter (HOSPITAL_BASED_OUTPATIENT_CLINIC_OR_DEPARTMENT_OTHER)
Admission: RE | Disposition: A | Discharge status: Home / Self Care | Payer: Self-pay | Source: Ambulatory Visit | Attending: General Surgery

## 2012-12-05 ENCOUNTER — Telehealth (INDEPENDENT_AMBULATORY_CARE_PROVIDER_SITE_OTHER): Payer: Self-pay

## 2012-12-05 ENCOUNTER — Encounter (HOSPITAL_BASED_OUTPATIENT_CLINIC_OR_DEPARTMENT_OTHER): Payer: Self-pay | Admitting: *Deleted

## 2012-12-05 DIAGNOSIS — I10 Essential (primary) hypertension: Secondary | ICD-10-CM | POA: Insufficient documentation

## 2012-12-05 DIAGNOSIS — Z538 Procedure and treatment not carried out for other reasons: Secondary | ICD-10-CM | POA: Insufficient documentation

## 2012-12-05 DIAGNOSIS — K219 Gastro-esophageal reflux disease without esophagitis: Secondary | ICD-10-CM | POA: Insufficient documentation

## 2012-12-05 DIAGNOSIS — M329 Systemic lupus erythematosus, unspecified: Secondary | ICD-10-CM | POA: Insufficient documentation

## 2012-12-05 DIAGNOSIS — Z79899 Other long term (current) drug therapy: Secondary | ICD-10-CM | POA: Insufficient documentation

## 2012-12-05 DIAGNOSIS — Z803 Family history of malignant neoplasm of breast: Secondary | ICD-10-CM | POA: Insufficient documentation

## 2012-12-05 DIAGNOSIS — N63 Unspecified lump in unspecified breast: Secondary | ICD-10-CM | POA: Insufficient documentation

## 2012-12-05 HISTORY — DX: Encounter for fitting and adjustment of spectacles and contact lenses: Z46.0

## 2012-12-05 SURGERY — BREAST BIOPSY
Anesthesia: General | Laterality: Right

## 2012-12-05 MED ORDER — CEFAZOLIN SODIUM-DEXTROSE 2-3 GM-% IV SOLR: 2.0000 g | INTRAVENOUS | Status: DC

## 2012-12-05 MED ORDER — LACTATED RINGERS IV SOLN: INTRAVENOUS | Status: DC

## 2012-12-05 MED ORDER — FENTANYL CITRATE 0.05 MG/ML IJ SOLN: 50.0000 ug | INTRAMUSCULAR | Status: DC | PRN

## 2012-12-05 MED ORDER — MIDAZOLAM HCL 2 MG/2ML IJ SOLN: 1.0000 mg | INTRAMUSCULAR | Status: DC | PRN

## 2012-12-05 SURGICAL SUPPLY — 53 items
ADH SKN CLS APL DERMABOND .7 (GAUZE/BANDAGES/DRESSINGS)
APL SKNCLS STERI-STRIP NONHPOA (GAUZE/BANDAGES/DRESSINGS) ×1
APPLIER CLIP 9.375 MED OPEN (MISCELLANEOUS)
APR CLP MED 9.3 20 MLT OPN (MISCELLANEOUS)
BENZOIN TINCTURE PRP APPL 2/3 (GAUZE/BANDAGES/DRESSINGS) ×2 IMPLANT
BINDER BREAST LRG (GAUZE/BANDAGES/DRESSINGS) IMPLANT
BINDER BREAST MEDIUM (GAUZE/BANDAGES/DRESSINGS) IMPLANT
BINDER BREAST XLRG (GAUZE/BANDAGES/DRESSINGS) IMPLANT
BINDER BREAST XXLRG (GAUZE/BANDAGES/DRESSINGS) IMPLANT
BLADE SURG 15 STRL LF DISP TIS (BLADE) ×1 IMPLANT
BLADE SURG 15 STRL SS (BLADE) ×2
CANISTER SUCTION 1200CC (MISCELLANEOUS) IMPLANT
CHLORAPREP W/TINT 26ML (MISCELLANEOUS) ×2 IMPLANT
CLIP APPLIE 9.375 MED OPEN (MISCELLANEOUS) IMPLANT
CLOTH BEACON ORANGE TIMEOUT ST (SAFETY) ×2 IMPLANT
COVER MAYO STAND STRL (DRAPES) ×2 IMPLANT
COVER TABLE BACK 60X90 (DRAPES) ×2 IMPLANT
DECANTER SPIKE VIAL GLASS SM (MISCELLANEOUS) IMPLANT
DERMABOND ADVANCED (GAUZE/BANDAGES/DRESSINGS)
DERMABOND ADVANCED .7 DNX12 (GAUZE/BANDAGES/DRESSINGS) IMPLANT
DEVICE DUBIN W/COMP PLATE 8390 (MISCELLANEOUS) IMPLANT
DRAPE PED LAPAROTOMY (DRAPES) ×2 IMPLANT
DRSG TEGADERM 4X4.75 (GAUZE/BANDAGES/DRESSINGS) ×2 IMPLANT
ELECT COATED BLADE 2.86 ST (ELECTRODE) ×2 IMPLANT
ELECT REM PT RETURN 9FT ADLT (ELECTROSURGICAL) ×2
ELECTRODE REM PT RTRN 9FT ADLT (ELECTROSURGICAL) ×1 IMPLANT
GAUZE SPONGE 4X4 12PLY STRL LF (GAUZE/BANDAGES/DRESSINGS) ×2 IMPLANT
GLOVE BIO SURGEON STRL SZ7 (GLOVE) ×2 IMPLANT
GLOVE BIOGEL PI IND STRL 7.5 (GLOVE) ×1 IMPLANT
GLOVE BIOGEL PI INDICATOR 7.5 (GLOVE) ×1
GOWN PREVENTION PLUS XLARGE (GOWN DISPOSABLE) ×2 IMPLANT
NDL HYPO 25X1 1.5 SAFETY (NEEDLE) ×1 IMPLANT
NEEDLE HYPO 25X1 1.5 SAFETY (NEEDLE) ×2 IMPLANT
NS IRRIG 1000ML POUR BTL (IV SOLUTION) IMPLANT
PACK BASIN DAY SURGERY FS (CUSTOM PROCEDURE TRAY) ×2 IMPLANT
PENCIL BUTTON HOLSTER BLD 10FT (ELECTRODE) ×2 IMPLANT
SLEEVE SCD COMPRESS KNEE MED (MISCELLANEOUS) ×2 IMPLANT
SPONGE LAP 4X18 X RAY DECT (DISPOSABLE) ×2 IMPLANT
STRIP CLOSURE SKIN 1/2X4 (GAUZE/BANDAGES/DRESSINGS) ×2 IMPLANT
SUT MNCRL AB 3-0 PS2 18 (SUTURE) IMPLANT
SUT MNCRL AB 4-0 PS2 18 (SUTURE) IMPLANT
SUT SILK 2 0 SH (SUTURE) ×2 IMPLANT
SUT VIC AB 2-0 SH 27 (SUTURE) ×2
SUT VIC AB 2-0 SH 27XBRD (SUTURE) ×1 IMPLANT
SUT VIC AB 3-0 SH 27 (SUTURE) ×2
SUT VIC AB 3-0 SH 27X BRD (SUTURE) ×1 IMPLANT
SUT VIC AB 5-0 PS2 18 (SUTURE) IMPLANT
SUT VICRYL AB 3 0 TIES (SUTURE) IMPLANT
SYR CONTROL 10ML LL (SYRINGE) ×2 IMPLANT
TOWEL OR 17X24 6PK STRL BLUE (TOWEL DISPOSABLE) ×2 IMPLANT
TOWEL OR NON WOVEN STRL DISP B (DISPOSABLE) ×2 IMPLANT
TUBE CONNECTING 20X1/4 (TUBING) IMPLANT
YANKAUER SUCT BULB TIP NO VENT (SUCTIONS) IMPLANT

## 2012-12-05 NOTE — H&P (View-Only) (Signed)
Patient ID: Sherry Campos, female   DOB: August 04, 1971, 42 y.o.   MRN: [id number]  Chief Complaint  Patient presents with  . Routine Post Op    reck br mass    HPI Sherry Campos is a 42 y.o. female.   HPI 51 yof with sle who I saw in December with a right breast mass that was not seen on mm/us.  She has very significant family history.  This area has not been changed since our last visit.  She is due to get her mm now.  She has recently been treated for bronchitis and is on a prednisone taper for lupus flare.  She returns today to discuss planning excision of this area.  Past Medical History  Diagnosis Date  . Breast tenderness   . Menorrhagia   . Endometrial polyp   . Hematuria   . Fibroids   . Simple ovarian cyst   . Irregular menstrual bleeding   . Post - coital bleeding   . UTI (urinary tract infection)   . Endometriosis   . BV (bacterial vaginosis)   . Pelvic pain in female   . Dysuria   . Rectal bleeding   . Constipation   . Breast nodule   . SLE (systemic lupus erythematosus) 2012  . GERD (gastroesophageal reflux disease)   . Hypertension   . Lupus     Past Surgical History  Procedure Laterality Date  . Cesarean section    . Diagnostic laparoscopy  2003  . Tonsilectomy, adenoidectomy, bilateral myringotomy and tubes      Family History  Problem Relation Age of Onset  . Cancer Mother     breast  . Diabetes Mother   . Stroke Mother   . Hypertension Mother   . Breast cancer Mother 42  . Diabetes Father   . Heart attack Father   . Hypertension Father   . Breast cancer Maternal Aunt 37  . Cancer Maternal Aunt     breast and lung  . Cancer Maternal Uncle     prostate  . Cancer Paternal Uncle     prostate    Social History History  Substance Use Topics  . Smoking status: Never Smoker   . Smokeless tobacco: Never Used  . Alcohol Use: No    Allergies  Allergen Reactions  . Sulfa Antibiotics     Current Outpatient Prescriptions   Medication Sig Dispense Refill  . hydroxychloroquine (PLAQUENIL) 200 MG tablet Take by mouth daily.      . isosorbide mononitrate (IMDUR) 30 MG 24 hr tablet       . meloxicam (MOBIC) 7.5 MG tablet Ad lib.      . metoprolol tartrate (LOPRESSOR) 25 MG tablet Take 25 mg by mouth 2 (two) times daily.      Marland Kitchen omeprazole (PRILOSEC) 40 MG capsule daily.      Marland Kitchen spironolactone (ALDACTONE) 25 MG tablet Take 25 mg by mouth daily.      . mycophenolate (CELLCEPT) 500 MG tablet Take by mouth 2 (two) times daily.       No current facility-administered medications for this visit.    Review of Systems Review of Systems  Constitutional: Negative for fever, chills and unexpected weight change.  HENT: Negative for hearing loss, congestion, sore throat, trouble swallowing and voice change.   Eyes: Negative for visual disturbance.  Respiratory: Negative for cough and wheezing.   Cardiovascular: Positive for palpitations. Negative for chest pain and leg swelling.  Gastrointestinal: Negative for  nausea, vomiting, abdominal pain, diarrhea, constipation, blood in stool, abdominal distention and anal bleeding.  Genitourinary: Negative for hematuria, vaginal bleeding and difficulty urinating.  Musculoskeletal: Positive for arthralgias.  Skin: Negative for rash and wound.  Neurological: Positive for weakness. Negative for seizures, syncope and headaches.  Hematological: Negative for adenopathy. Does not bruise/bleed easily.  Psychiatric/Behavioral: Negative for confusion.    Blood pressure 140/70, pulse 80, temperature 98.9 F (37.2 C), temperature source Temporal, resp. rate 18, height 5' 5.5" (1.664 m), weight 172 lb 6.4 oz (78.2 kg).  Physical Exam Physical Exam  Vitals reviewed. Constitutional: She appears well-developed and well-nourished.  Neck: Neck supple.  Cardiovascular: Normal rate, regular rhythm and normal heart sounds.   Pulmonary/Chest: Effort normal and breath sounds normal. She has no  wheezes. She has no rales. Right breast exhibits mass. Right breast exhibits no inverted nipple, no nipple discharge, no skin change and no tenderness. Left breast exhibits no inverted nipple, no mass, no nipple discharge, no skin change and no tenderness. Breasts are symmetrical.    Lymphadenopathy:    She has no cervical adenopathy.    She has no axillary adenopathy.       Right: No supraclavicular adenopathy present.       Left: No supraclavicular adenopathy present.    Data Reviewed Prior mmg/us reports, my prior note  Assessment    Right breast mass, family history of breast cancer    Plan    This area has not really changed since our last visit.  It was not apparent on mmg/us before but we both can identify it easily on exam.  She is due to get mmg now and I told her I would like her to have this done prior to excising this area.  She will get this done and have results sent to me.  I still recommended right breast mass excision.  We discussed risks including further surgery if this is a cancer, bleeding, infection.       WAKEFIELD,MATTHEW 11/25/2012, 9:31 PM

## 2012-12-05 NOTE — Telephone Encounter (Signed)
Called pt to make her a f/u appt with Dr Dwain Sarna in 6-8wks b/c the pt needs to have the breast mass checked again since her surgery got canceled today. The breast mass had gotten a lot smaller so Dr Dwain Sarna just wanted to check it again instead of doing surgery. The appt is scheduled for 01/27/13.

## 2012-12-05 NOTE — Interval H&P Note (Signed)
History and Physical Interval Note:  12/05/2012 9:26 AM  Sherry Campos  has presented today for surgery, with the diagnosis of right breast mass  She has not really felt this area on her own exam for past several days.  She has mm/us that are both negative also and we were doing as she is young with palpable mass.  Neither of [place] today can really definitively tell this area is there.  We have decided to cancel her case and I will see back in 6-8 weeks  [facility]

## 2013-01-27 ENCOUNTER — Encounter (INDEPENDENT_AMBULATORY_CARE_PROVIDER_SITE_OTHER): Payer: BC Managed Care – PPO | Admitting: General Surgery

## 2013-02-17 ENCOUNTER — Ambulatory Visit (INDEPENDENT_AMBULATORY_CARE_PROVIDER_SITE_OTHER): Payer: BC Managed Care – PPO | Admitting: General Surgery

## 2013-02-17 ENCOUNTER — Encounter (INDEPENDENT_AMBULATORY_CARE_PROVIDER_SITE_OTHER): Payer: Self-pay | Admitting: General Surgery

## 2013-02-17 VITALS — BP 124/64 | HR 71 | Temp 98.0°F | Resp 18 | Ht 65.5 in | Wt 180.8 lb

## 2013-02-17 DIAGNOSIS — N6019 Diffuse cystic mastopathy of unspecified breast: Secondary | ICD-10-CM

## 2013-02-17 DIAGNOSIS — N6011 Diffuse cystic mastopathy of right breast: Secondary | ICD-10-CM

## 2013-02-17 DIAGNOSIS — R2241 Localized swelling, mass and lump, right lower limb: Secondary | ICD-10-CM

## 2013-02-17 DIAGNOSIS — R29898 Other symptoms and signs involving the musculoskeletal system: Secondary | ICD-10-CM

## 2013-02-17 NOTE — Progress Notes (Signed)
Subjective:     Patient ID: Sherry Campos, female   DOB: 1971/02/28, 42 y.o.   MRN: [id number]  HPI  This is a 42 year old female who had presented with a right breast mass it did not show up on imaging. She and I both had palpated this area but on the day of her surgery she told me that she is not able to feel it for some time. I examined her I also did not definitively feel an area. Due to the fact that it had no imaging correlates and now there was no real palpable abnormality we decided to just followup in the office. She returns today without any complaints about her right breast and cannot feel this area either. She also complains that over the last several weeks she has developed a mass on her right hip region that has remained about the same. This is really asymptomatic except for that there is a mass there.  Review of Systems     Objective:   Physical Exam  Pulmonary/Chest: Right breast exhibits no inverted nipple, no mass, no nipple discharge, no skin change and no tenderness.  Musculoskeletal:       Back:       Assessment:     Resolved right breast mass Buttock mass     Plan:     The anything further to do with the breast mass except for a container on exams and continue with known breast cancer screening. I think that the mass in her hip may just be a cyst or a benign area as well. We both agreed to keep an eye on this I told her this got larger  to please call me back.

## 2013-10-28 ENCOUNTER — Other Ambulatory Visit: Payer: Self-pay

## 2013-10-28 DIAGNOSIS — Z1231 Encounter for screening mammogram for malignant neoplasm of breast: Secondary | ICD-10-CM

## 2013-12-02 ENCOUNTER — Ambulatory Visit
Admission: RE | Admit: 2013-12-02 | Discharge: 2013-12-02 | Disposition: A | Discharge status: Home / Self Care | Payer: BC Managed Care – PPO | Source: Ambulatory Visit

## 2013-12-02 DIAGNOSIS — Z1231 Encounter for screening mammogram for malignant neoplasm of breast: Secondary | ICD-10-CM

## 2013-12-03 ENCOUNTER — Other Ambulatory Visit: Payer: Self-pay | Admitting: Obstetrics and Gynecology

## 2013-12-03 DIAGNOSIS — R928 Other abnormal and inconclusive findings on diagnostic imaging of breast: Secondary | ICD-10-CM

## 2013-12-16 ENCOUNTER — Ambulatory Visit
Admission: RE | Admit: 2013-12-16 | Discharge: 2013-12-16 | Disposition: A | Discharge status: Home / Self Care | Payer: BC Managed Care – PPO | Source: Ambulatory Visit | Attending: Obstetrics and Gynecology | Admitting: Obstetrics and Gynecology

## 2013-12-16 ENCOUNTER — Other Ambulatory Visit: Payer: Self-pay | Admitting: Obstetrics and Gynecology

## 2013-12-16 DIAGNOSIS — N63 Unspecified lump in unspecified breast: Secondary | ICD-10-CM

## 2013-12-16 DIAGNOSIS — R928 Other abnormal and inconclusive findings on diagnostic imaging of breast: Secondary | ICD-10-CM

## 2014-05-28 ENCOUNTER — Other Ambulatory Visit: Payer: Self-pay | Admitting: Obstetrics and Gynecology

## 2014-05-28 DIAGNOSIS — N6489 Other specified disorders of breast: Secondary | ICD-10-CM

## 2014-06-05 ENCOUNTER — Ambulatory Visit
Admission: RE | Admit: 2014-06-05 | Discharge: 2014-06-05 | Disposition: A | Discharge status: Home / Self Care | Payer: BC Managed Care – PPO | Source: Ambulatory Visit | Attending: Obstetrics and Gynecology | Admitting: Obstetrics and Gynecology

## 2014-06-05 DIAGNOSIS — N6489 Other specified disorders of breast: Secondary | ICD-10-CM

## 2014-07-20 ENCOUNTER — Encounter (INDEPENDENT_AMBULATORY_CARE_PROVIDER_SITE_OTHER): Payer: Self-pay | Admitting: General Surgery

## 2014-10-01 ENCOUNTER — Encounter (HOSPITAL_BASED_OUTPATIENT_CLINIC_OR_DEPARTMENT_OTHER): Payer: Self-pay | Admitting: General Surgery

## 2014-11-03 ENCOUNTER — Other Ambulatory Visit: Payer: Self-pay | Admitting: Obstetrics and Gynecology

## 2014-11-03 DIAGNOSIS — N6489 Other specified disorders of breast: Secondary | ICD-10-CM

## 2014-12-04 ENCOUNTER — Ambulatory Visit
Admission: RE | Admit: 2014-12-04 | Discharge: 2014-12-04 | Disposition: A | Discharge status: Home / Self Care | Payer: BC Managed Care – PPO | Source: Ambulatory Visit | Attending: Obstetrics and Gynecology | Admitting: Obstetrics and Gynecology

## 2014-12-04 DIAGNOSIS — N6489 Other specified disorders of breast: Secondary | ICD-10-CM

## 2015-05-05 ENCOUNTER — Other Ambulatory Visit: Payer: Self-pay | Admitting: Obstetrics and Gynecology

## 2015-05-05 DIAGNOSIS — N6489 Other specified disorders of breast: Secondary | ICD-10-CM

## 2015-06-07 ENCOUNTER — Ambulatory Visit
Admission: RE | Admit: 2015-06-07 | Discharge: 2015-06-07 | Disposition: A | Discharge status: Home / Self Care | Payer: BC Managed Care – PPO | Source: Ambulatory Visit | Attending: Obstetrics and Gynecology | Admitting: Obstetrics and Gynecology

## 2015-06-07 DIAGNOSIS — N6489 Other specified disorders of breast: Secondary | ICD-10-CM

## 2015-09-19 DIAGNOSIS — J189 Pneumonia, unspecified organism: Secondary | ICD-10-CM

## 2015-09-19 HISTORY — DX: Pneumonia, unspecified organism: J18.9

## 2015-10-27 ENCOUNTER — Other Ambulatory Visit: Payer: Self-pay | Admitting: Obstetrics and Gynecology

## 2015-10-27 DIAGNOSIS — N6489 Other specified disorders of breast: Secondary | ICD-10-CM

## 2015-12-17 ENCOUNTER — Ambulatory Visit
Admission: RE | Admit: 2015-12-17 | Discharge: 2015-12-17 | Disposition: A | Discharge status: Home / Self Care | Payer: BC Managed Care – PPO | Source: Ambulatory Visit | Attending: Obstetrics and Gynecology | Admitting: Obstetrics and Gynecology

## 2015-12-17 DIAGNOSIS — N6489 Other specified disorders of breast: Secondary | ICD-10-CM

## 2016-12-08 ENCOUNTER — Other Ambulatory Visit: Payer: Self-pay | Admitting: Obstetrics and Gynecology

## 2016-12-08 DIAGNOSIS — Z1231 Encounter for screening mammogram for malignant neoplasm of breast: Secondary | ICD-10-CM

## 2017-01-08 ENCOUNTER — Ambulatory Visit
Admission: RE | Admit: 2017-01-08 | Discharge: 2017-01-08 | Disposition: A | Discharge status: Home / Self Care | Payer: BC Managed Care – PPO | Source: Ambulatory Visit | Attending: Obstetrics and Gynecology | Admitting: Obstetrics and Gynecology

## 2017-01-08 DIAGNOSIS — Z1231 Encounter for screening mammogram for malignant neoplasm of breast: Secondary | ICD-10-CM

## 2017-01-10 ENCOUNTER — Other Ambulatory Visit: Payer: Self-pay | Admitting: Obstetrics and Gynecology

## 2017-01-10 DIAGNOSIS — R928 Other abnormal and inconclusive findings on diagnostic imaging of breast: Secondary | ICD-10-CM

## 2017-01-15 ENCOUNTER — Ambulatory Visit
Admission: RE | Admit: 2017-01-15 | Discharge: 2017-01-15 | Disposition: A | Discharge status: Home / Self Care | Payer: BC Managed Care – PPO | Source: Ambulatory Visit | Attending: Obstetrics and Gynecology | Admitting: Obstetrics and Gynecology

## 2017-01-15 DIAGNOSIS — R928 Other abnormal and inconclusive findings on diagnostic imaging of breast: Secondary | ICD-10-CM

## 2017-07-25 ENCOUNTER — Other Ambulatory Visit: Payer: Self-pay | Admitting: Obstetrics and Gynecology

## 2017-08-06 NOTE — Patient Instructions (Addendum)
Your procedure is scheduled on: Friday August 17, 2017 at 1:00 pm  Enter through the Main Entrance of [facility] at: 11:30 am   Pick up the phone at the desk and dial [phone].  Call this number if you have problems the morning of surgery: [phone].  Remember: Do NOT eat food: after Midnight on Thursday November 29 Do NOT drink clear liquids after: 7:00 am day of surgery  Take these medicines the morning of surgery with a SIP OF WATER:  Coreg, nexium, plaquenil, imdur and cellcept.  You may take allegra and flonase nasal spray if needed.  Bring albuterol inhaler with you on day of surgery.  Do NOT wear jewelry (body piercing), metal hair clips/bobby pins, make-up, or nail polish. Do NOT wear lotions, powders, or perfumes.  You may wear deoderant. Do NOT shave for 48 hours prior to surgery. Do NOT bring valuables to the hospital. Contacts, dentures, or bridgework may not be worn into surgery.  Have a responsible adult drive you home and stay with you for 24 hours after your procedure

## 2017-08-08 ENCOUNTER — Encounter (HOSPITAL_COMMUNITY)
Admission: RE | Admit: 2017-08-08 | Discharge: 2017-08-08 | Disposition: A | Discharge status: Home / Self Care | Payer: BC Managed Care – PPO | Source: Ambulatory Visit | Attending: Obstetrics and Gynecology | Admitting: Obstetrics and Gynecology

## 2017-08-08 ENCOUNTER — Other Ambulatory Visit: Payer: Self-pay

## 2017-08-08 ENCOUNTER — Encounter (HOSPITAL_COMMUNITY): Payer: Self-pay

## 2017-08-08 DIAGNOSIS — Z01810 Encounter for preprocedural cardiovascular examination: Secondary | ICD-10-CM | POA: Diagnosis not present

## 2017-08-08 DIAGNOSIS — R9431 Abnormal electrocardiogram [ECG] [EKG]: Secondary | ICD-10-CM | POA: Insufficient documentation

## 2017-08-08 HISTORY — DX: Family history of other specified conditions: Z84.89

## 2017-08-08 HISTORY — DX: Systemic sclerosis, unspecified: M34.9

## 2017-08-08 HISTORY — DX: Interstitial pulmonary disease, unspecified: J84.9

## 2017-08-08 HISTORY — DX: Pneumonia, unspecified organism: J18.9

## 2017-08-08 HISTORY — DX: Anemia, unspecified: D64.9

## 2017-08-08 HISTORY — DX: Other overlap syndromes: M35.1

## 2017-08-08 HISTORY — DX: Nonrheumatic mitral (valve) prolapse: I34.1

## 2017-08-08 LAB — BASIC METABOLIC PANEL
ANION GAP: 7 (ref 5–15)
BUN: 8 mg/dL (ref 6–20)
CO2: 25 mmol/L (ref 22–32)
Calcium: 9.3 mg/dL (ref 8.9–10.3)
Chloride: 106 mmol/L (ref 101–111)
Creatinine, Ser: 0.77 mg/dL (ref 0.44–1.00)
GFR calc Af Amer: >60 mL/min (ref >60)
GFR calc non Af Amer: >60 mL/min (ref >60)
GLUCOSE: 88 mg/dL (ref 65–99)
POTASSIUM: 4 mmol/L (ref 3.5–5.1)
Sodium: 138 mmol/L (ref 135–145)

## 2017-08-08 LAB — CBC
HEMATOCRIT: 41.8 % (ref 36.0–46.0)
Hemoglobin: 13.2 g/dL (ref 12.0–15.0)
MCH: 28.9 pg (ref 26.0–34.0)
MCHC: 31.6 g/dL (ref 30.0–36.0)
MCV: 91.7 fL (ref 78.0–100.0)
Platelets: 322 10*3/uL (ref 150–400)
RBC: 4.56 MIL/uL (ref 3.87–5.11)
RDW: 13.6 % (ref 11.5–15.5)
WBC: 2.7 10*3/uL — AB (ref 4.0–10.5)

## 2017-08-17 ENCOUNTER — Ambulatory Visit (HOSPITAL_COMMUNITY): Payer: BC Managed Care – PPO | Admitting: Anesthesiology

## 2017-08-17 ENCOUNTER — Encounter (HOSPITAL_COMMUNITY): Payer: Self-pay | Admitting: Emergency Medicine

## 2017-08-17 ENCOUNTER — Ambulatory Visit (HOSPITAL_COMMUNITY)
Admission: AD | Admit: 2017-08-17 | Discharge: 2017-08-17 | Disposition: A | Discharge status: Home / Self Care | Payer: BC Managed Care – PPO | Source: Ambulatory Visit | Attending: Obstetrics and Gynecology | Admitting: Obstetrics and Gynecology

## 2017-08-17 ENCOUNTER — Encounter (HOSPITAL_COMMUNITY)
Admission: AD | Disposition: A | Discharge status: Home / Self Care | Payer: Self-pay | Source: Ambulatory Visit | Attending: Obstetrics and Gynecology

## 2017-08-17 ENCOUNTER — Other Ambulatory Visit: Payer: Self-pay

## 2017-08-17 DIAGNOSIS — Z803 Family history of malignant neoplasm of breast: Secondary | ICD-10-CM | POA: Diagnosis not present

## 2017-08-17 DIAGNOSIS — M329 Systemic lupus erythematosus, unspecified: Secondary | ICD-10-CM | POA: Diagnosis not present

## 2017-08-17 DIAGNOSIS — M351 Other overlap syndromes: Secondary | ICD-10-CM | POA: Insufficient documentation

## 2017-08-17 DIAGNOSIS — M349 Systemic sclerosis, unspecified: Secondary | ICD-10-CM | POA: Diagnosis not present

## 2017-08-17 DIAGNOSIS — Z801 Family history of malignant neoplasm of trachea, bronchus and lung: Secondary | ICD-10-CM | POA: Diagnosis not present

## 2017-08-17 DIAGNOSIS — Z888 Allergy status to other drugs, medicaments and biological substances status: Secondary | ICD-10-CM | POA: Diagnosis not present

## 2017-08-17 DIAGNOSIS — N840 Polyp of corpus uteri: Secondary | ICD-10-CM | POA: Insufficient documentation

## 2017-08-17 DIAGNOSIS — I10 Essential (primary) hypertension: Secondary | ICD-10-CM | POA: Insufficient documentation

## 2017-08-17 DIAGNOSIS — I341 Nonrheumatic mitral (valve) prolapse: Secondary | ICD-10-CM | POA: Insufficient documentation

## 2017-08-17 DIAGNOSIS — J849 Interstitial pulmonary disease, unspecified: Secondary | ICD-10-CM | POA: Insufficient documentation

## 2017-08-17 DIAGNOSIS — Z8042 Family history of malignant neoplasm of prostate: Secondary | ICD-10-CM | POA: Diagnosis not present

## 2017-08-17 DIAGNOSIS — K219 Gastro-esophageal reflux disease without esophagitis: Secondary | ICD-10-CM | POA: Insufficient documentation

## 2017-08-17 DIAGNOSIS — D250 Submucous leiomyoma of uterus: Secondary | ICD-10-CM | POA: Insufficient documentation

## 2017-08-17 DIAGNOSIS — N950 Postmenopausal bleeding: Secondary | ICD-10-CM | POA: Insufficient documentation

## 2017-08-17 DIAGNOSIS — Z882 Allergy status to sulfonamides status: Secondary | ICD-10-CM | POA: Diagnosis not present

## 2017-08-17 DIAGNOSIS — Z833 Family history of diabetes mellitus: Secondary | ICD-10-CM | POA: Insufficient documentation

## 2017-08-17 DIAGNOSIS — Z823 Family history of stroke: Secondary | ICD-10-CM | POA: Insufficient documentation

## 2017-08-17 DIAGNOSIS — Z8249 Family history of ischemic heart disease and other diseases of the circulatory system: Secondary | ICD-10-CM | POA: Diagnosis not present

## 2017-08-17 HISTORY — DX: Raynaud's syndrome without gangrene: I73.00

## 2017-08-17 HISTORY — PX: DILATATION & CURETTAGE/HYSTEROSCOPY WITH MYOSURE: SHX6511

## 2017-08-17 SURGERY — DILATATION & CURETTAGE/HYSTEROSCOPY WITH MYOSURE
Anesthesia: General | Site: Vagina

## 2017-08-17 MED ORDER — FENTANYL CITRATE (PF) 100 MCG/2ML IJ SOLN
INTRAMUSCULAR | Status: AC
  Filled 2017-08-17: qty 2

## 2017-08-17 MED ORDER — IBUPROFEN 800 MG PO TABS: 800.0000 mg | ORAL_TABLET | Freq: Three times a day (TID) | ORAL | 4 refills | Status: DC | PRN

## 2017-08-17 MED ORDER — LIDOCAINE HCL (CARDIAC) 20 MG/ML IV SOLN
INTRAVENOUS | Status: AC
  Filled 2017-08-17: qty 5

## 2017-08-17 MED ORDER — SCOPOLAMINE 1 MG/3DAYS TD PT72
MEDICATED_PATCH | TRANSDERMAL | Status: AC
  Administered 2017-08-17: 1.5 mg via TRANSDERMAL
  Filled 2017-08-17: qty 1

## 2017-08-17 MED ORDER — KETOROLAC TROMETHAMINE 30 MG/ML IJ SOLN
INTRAMUSCULAR | Status: DC | PRN
  Administered 2017-08-17: 30 mg via INTRAVENOUS

## 2017-08-17 MED ORDER — ONDANSETRON HCL 4 MG/2ML IJ SOLN
INTRAMUSCULAR | Status: AC
  Filled 2017-08-17: qty 2

## 2017-08-17 MED ORDER — PROPOFOL 10 MG/ML IV BOLUS
INTRAVENOUS | Status: DC | PRN
  Administered 2017-08-17: 180 mg via INTRAVENOUS

## 2017-08-17 MED ORDER — DEXAMETHASONE SODIUM PHOSPHATE 4 MG/ML IJ SOLN
INTRAMUSCULAR | Status: AC
  Filled 2017-08-17: qty 1

## 2017-08-17 MED ORDER — SCOPOLAMINE 1 MG/3DAYS TD PT72
1.0000 | MEDICATED_PATCH | Freq: Once | TRANSDERMAL | Status: DC
  Administered 2017-08-17: 1.5 mg via TRANSDERMAL

## 2017-08-17 MED ORDER — LIDOCAINE HCL (CARDIAC) 20 MG/ML IV SOLN
INTRAVENOUS | Status: DC | PRN
  Administered 2017-08-17: 100 mg via INTRAVENOUS

## 2017-08-17 MED ORDER — LACTATED RINGERS IV SOLN
INTRAVENOUS | Status: DC
  Administered 2017-08-17: 125 mL/h via INTRAVENOUS

## 2017-08-17 MED ORDER — PROPOFOL 10 MG/ML IV BOLUS
INTRAVENOUS | Status: AC
  Filled 2017-08-17: qty 20

## 2017-08-17 MED ORDER — ONDANSETRON HCL 4 MG/2ML IJ SOLN
INTRAMUSCULAR | Status: DC | PRN
  Administered 2017-08-17: 4 mg via INTRAVENOUS

## 2017-08-17 MED ORDER — MIDAZOLAM HCL 2 MG/2ML IJ SOLN
INTRAMUSCULAR | Status: DC | PRN
  Administered 2017-08-17: 2 mg via INTRAVENOUS

## 2017-08-17 MED ORDER — MIDAZOLAM HCL 2 MG/2ML IJ SOLN
INTRAMUSCULAR | Status: AC
  Filled 2017-08-17: qty 2

## 2017-08-17 MED ORDER — DEXAMETHASONE SODIUM PHOSPHATE 10 MG/ML IJ SOLN
INTRAMUSCULAR | Status: DC | PRN
  Administered 2017-08-17: 4 mg via INTRAVENOUS

## 2017-08-17 MED ORDER — SODIUM CHLORIDE 0.9 % IR SOLN
Status: DC | PRN
  Administered 2017-08-17 (×3): 3000 mL

## 2017-08-17 MED ORDER — PHENYLEPHRINE HCL 10 MG/ML IJ SOLN
INTRAMUSCULAR | Status: DC | PRN
  Administered 2017-08-17 (×2): 40 ug via INTRAVENOUS

## 2017-08-17 MED ORDER — KETOROLAC TROMETHAMINE 30 MG/ML IJ SOLN
INTRAMUSCULAR | Status: AC
  Filled 2017-08-17: qty 1

## 2017-08-17 MED ORDER — FENTANYL CITRATE (PF) 100 MCG/2ML IJ SOLN
INTRAMUSCULAR | Status: DC | PRN
Start: 2017-08-17 — End: 2017-08-17
  Administered 2017-08-17: 25 ug via INTRAVENOUS

## 2017-08-17 MED ORDER — SUGAMMADEX SODIUM 200 MG/2ML IV SOLN
INTRAVENOUS | Status: AC
  Filled 2017-08-17: qty 2

## 2017-08-17 MED ORDER — PHENYLEPHRINE 40 MCG/ML (10ML) SYRINGE FOR IV PUSH (FOR BLOOD PRESSURE SUPPORT)
PREFILLED_SYRINGE | INTRAVENOUS | Status: AC
  Filled 2017-08-17: qty 10

## 2017-08-17 SURGICAL SUPPLY — 16 items
CANISTER SUCT 3000ML PPV (MISCELLANEOUS) ×3 IMPLANT
CATH ROBINSON RED A/P 16FR (CATHETERS) ×2 IMPLANT
CONTAINER PREFILL 10% NBF 60ML (FORM) ×3 IMPLANT
DEVICE MYOSURE LITE (MISCELLANEOUS) IMPLANT
DEVICE MYOSURE REACH (MISCELLANEOUS) ×1 IMPLANT
FILTER ARTHROSCOPY CONVERTOR (FILTER) ×2 IMPLANT
GLOVE BIOGEL PI IND STRL 7.0 (GLOVE) ×2 IMPLANT
GLOVE BIOGEL PI INDICATOR 7.0 (GLOVE) ×2
GLOVE ECLIPSE 6.5 STRL STRAW (GLOVE) ×2 IMPLANT
GOWN STRL REUS W/TWL LRG LVL3 (GOWN DISPOSABLE) ×4 IMPLANT
PACK VAGINAL MINOR WOMEN LF (CUSTOM PROCEDURE TRAY) ×2 IMPLANT
PAD OB MATERNITY 4.3X12.25 (PERSONAL CARE ITEMS) ×2 IMPLANT
SEAL ROD LENS SCOPE MYOSURE (ABLATOR) ×2 IMPLANT
TOWEL OR 17X24 6PK STRL BLUE (TOWEL DISPOSABLE) ×4 IMPLANT
TUBING AQUILEX INFLOW (TUBING) ×2 IMPLANT
TUBING AQUILEX OUTFLOW (TUBING) ×2 IMPLANT

## 2017-08-17 NOTE — Transfer of Care (Signed)
Immediate Anesthesia Transfer of Care Note  Patient: Sherry Campos  Procedure(s) Performed: DILATATION & CURETTAGE/HYSTEROSCOPY WITH MYOSURE (N/A Vagina )  Patient Location: PACU  Anesthesia Type:General  Level of Consciousness: awake, alert  and oriented  Airway & Oxygen Therapy: Patient Spontanous Breathing and Patient connected to nasal cannula oxygen  Post-op Assessment: Report given to RN, Post -op Vital signs reviewed and stable and Patient moving all extremities  Post vital signs: Reviewed and stable  Last Vitals:  Vitals:   08/17/17 1148  BP: (!) 141/89  Pulse: 85  Resp: 16  Temp: 36.9 C  SpO2: 100%    Last Pain:  Vitals:   08/17/17 1148  TempSrc: Oral      Patients Stated Pain Goal: 4 (59/16/38 4665)  Complications: No apparent anesthesia complications

## 2017-08-17 NOTE — Anesthesia Preprocedure Evaluation (Signed)
Anesthesia Evaluation  Patient identified by MRN, date of birth, ID band Patient awake    Reviewed: Allergy & Precautions, H&P , NPO status , Patient's Chart, lab work & pertinent test results  Airway Mallampati: II   Neck ROM: full    Dental   Pulmonary  Interstitial lung disease r/t lupus   breath sounds clear to auscultation       Cardiovascular hypertension,  Rhythm:regular Rate:Normal     Neuro/Psych    GI/Hepatic GERD  ,  Endo/Other    Renal/GU      Musculoskeletal   Abdominal   Peds  Hematology   Anesthesia Other Findings   Reproductive/Obstetrics Post menopausal bleeding                             Anesthesia Physical Anesthesia Plan  ASA: II  Anesthesia Plan: General   Post-op Pain Management:    Induction: Intravenous  PONV Risk Score and Plan: 3 and Ondansetron, Dexamethasone, Midazolam, Scopolamine patch - Pre-op and Treatment may vary due to age or medical condition  Airway Management Planned: LMA  Additional Equipment:   Intra-op Plan:   Post-operative Plan:   Informed Consent: I have reviewed the patients History and Physical, chart, labs and discussed the procedure including the risks, benefits and alternatives for the proposed anesthesia with the patient or authorized representative who has indicated his/her understanding and acceptance.     Plan Discussed with: CRNA, Anesthesiologist and Surgeon  Anesthesia Plan Comments:         Anesthesia Quick Evaluation

## 2017-08-17 NOTE — Anesthesia Procedure Notes (Signed)
Procedure Name: LMA Insertion Date/Time: 08/17/2017 1:04 PM Performed by: Hewitt Blade, CRNA Pre-anesthesia Checklist: Patient identified, Emergency Drugs available, Suction available and Patient being monitored Patient Re-evaluated:Patient Re-evaluated prior to induction Oxygen Delivery Method: Circle system utilized Preoxygenation: Pre-oxygenation with 100% oxygen Induction Type: IV induction Ventilation: Mask ventilation without difficulty LMA Size: 3.0 Number of attempts: 1 Placement Confirmation: positive ETCO2 and breath sounds checked- equal and bilateral Tube secured with: Tape Dental Injury: Teeth and Oropharynx as per pre-operative assessment

## 2017-08-17 NOTE — Brief Op Note (Signed)
08/17/2017  2:16 PM  PATIENT:  Sherry Campos  46 y.o. female  PRE-OPERATIVE DIAGNOSIS:  Postmenopausal Bleeding  POST-OPERATIVE DIAGNOSIS:  Postmenopausal Bleeding, SM fibroid, endometrial/endocervical polyps  PROCEDURE:  Diagnostic hysteroscopy, hysteroscopic resection of SM fibroid and polyps, dilation and curettage  SURGEON:  Surgeon(s) and Role:    * Alexsandro Salek, Alanda Slim, MD - Primary  PHYSICIAN ASSISTANT:   ASSISTANTS: none   ANESTHESIA:   general FINDINGS; large endocervical polyp, small endometrial polyps, intermittent endometrial thickening, tubal ostias seen, small ant SM fibroid EBL:  5 mL   BLOOD ADMINISTERED:none  DRAINS: none   LOCAL MEDICATIONS USED:  NONE  SPECIMEN:  Source of Specimen:  emc with fibroid resection and polyps  DISPOSITION OF SPECIMEN:  PATHOLOGY  COUNTS:  YES  TOURNIQUET:  * No tourniquets in log *  DICTATION: .Other Dictation: Dictation Number [id number]    PLAN OF CARE: Discharge to home after PACU  PATIENT DISPOSITION:  PACU - hemodynamically stable.   Delay start of Pharmacological VTE agent (>24hrs) due to surgical blood loss or risk of bleeding: no

## 2017-08-17 NOTE — H&P (Signed)
Sherry Campos is an 46 y.o. female BF presents for surgical mgmt of persistent PMB. Pt has had sonogram( 05/2017) with multiple uterine fibroids noted Ebx done was benign. Pt had recurrence of vaginal bleeding not responsive to aygestin  Pertinent Gynecological History: Menses: post-menopausal Bleeding: post menopausal bleeding Contraception: none DES exposure: denies Blood transfusions: none Sexually transmitted diseases: no past history Previous GYN Procedures: none  Last mammogram: normal Date: 2018 Last pap: normal Date: 2018 OB History: P1   Menstrual History: Menarche age: n/a Patient's last menstrual period was 11/19/2012.    Past Medical History:  Diagnosis Date  . Anemia   . Breast nodule   . Breast tenderness   . BV (bacterial vaginosis)   . Constipation   . Contact lens/glasses fitting    wears contacts or glasses  . Dysuria   . Endometrial polyp   . Endometriosis   . Family history of adverse reaction to anesthesia    unsure what complications were  . Fibroids   . GERD (gastroesophageal reflux disease)   . Hematuria   . Hypertension   . Interstitial lung disease ([place])    early signs related to Lupus  . Irregular menstrual bleeding   . Lupus   . Menorrhagia   . Mitral valve prolapse    25 years ago  . Mixed connective tissue disease ([place])    Lupus  . Pelvic pain in female   . Pneumonia 2017  . Post - coital bleeding   . Rectal bleeding   . 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 Simple ovarian cyst   . SLE (systemic lupus erythematosus) ([place]) 2012  . UTI (urinary tract infection)     Past Surgical History:  Procedure Laterality Date  . BREAST BIOPSY Right    2015  . CESAREAN SECTION    . DIAGNOSTIC LAPAROSCOPY  2003  . EYE SURGERY  1976  . FOOT BONE EXCISION     lt spurs  . STRABISMUS SURGERY     age 72  . TONSILECTOMY, ADENOIDECTOMY, BILATERAL MYRINGOTOMY AND TUBES      Family History  Problem Relation Age of Onset  . Cancer  Mother        breast  . Diabetes Mother   . Stroke Mother   . Hypertension Mother   . Breast cancer Mother 32  . Diabetes Father   . Heart attack Father   . Hypertension Father   . Breast cancer Maternal Aunt 63  . Cancer Maternal Aunt        breast and lung  . Cancer Maternal Uncle        prostate  . Cancer Paternal Uncle        prostate    Social History:  reports that  has never smoked. she has never used smokeless tobacco. She reports that she does not drink alcohol or use drugs.  Allergies:  Allergies  Allergen Reactions  . Ace Inhibitors Shortness Of Breath  . Amlodipine Swelling  . Hydrochlorothiazide Swelling  . Amiloride Other (See Comments)    Chest tightening   . Pecan Extract Allergy Skin Test Itching    Itching inside of the mouth   . Vaccinium Angustifolium Itching    Itching inside of the mouth (blue berries)  . Sulfa Antibiotics Itching and Rash    No medications prior to admission.    Review of Systems  All other systems reviewed and are negative. PE: WDWN BF in NAD BP Marland Kitchen)  141/89   Pulse 85   Temp 98.4 F (36.9 C) (Oral)   Resp 16   LMP 11/19/2012   SpO2 100%  Skin no lesion HEENT anicteric sclera pink conjunctiva Oropharynx neg Cor RRR Lungs clear to A Abd soft obese non tender Pelvic vulva no lesion Vagina scant blood Cervix nl  uterus irreg fibroids Adnexa n;  Last menstrual period 11/19/2012.  No results found for this or any previous visit (from the past 24 hour(s)).  No results found.  Assessment/Plan: PMB Uterine fibroids P) dx hysteroscopy, D&C. Risk of surgery includes infection, bleeding, injury to surrounding organ structures, uterine perforation( 09/998) and its risk,  Thermal injury, fluid overload, All ? answered Divante Kotch A Trevor Wilkie 08/17/2017, 5:52 AM

## 2017-08-17 NOTE — Discharge Instructions (Addendum)
CALL  IF TEMP>100.4, NOTHING PER VAGINA X 1 WK, CALL IF SOAKING A MAXI  PAD EVERY HOUR OR MORE FREQUENTLYDISCHARGE INSTRUCTIONS:  HYSTEROSCOPY / ENDOMETRIAL ABLATION The following instructions have been prepared to help you care for yourself upon your return home.  May Remove Scop patch on or before: Monday December 3  May take Ibuprofen after: 7:45 pm tonight  May take stool softner while taking narcotic pain medication to prevent constipation.  Drink plenty of water.  Personal hygiene:  Use sanitary pads for vaginal drainage, not tampons.  Shower the day after your procedure.  NO tub baths, pools or Jacuzzis for 1 weeks.  Wipe front to back after using the bathroom.  Activity and limitations:  Do NOT drive or operate any equipment for 24 hours. The effects of anesthesia are still present and drowsiness may result.  Do NOT rest in bed all day.  Walking is encouraged.  Walk up and down stairs slowly.  You may resume your normal activity in one to two days or as indicated by your physician. Sexual activity: NO intercourse for at least 2 weeks after the procedure, or as indicated by your Doctor.  Diet: Eat a light meal as desired this evening. You may resume your usual diet tomorrow.  Return to Work: You may resume your work activities in one to two days or as indicated by Marine scientist.  What to expect after your surgery: Expect to have vaginal bleeding/discharge for 2-3 days and spotting for up to 10 days. It is not unusual to have soreness for up to 1-2 weeks. You may have a slight burning sensation when you urinate for the first day. Mild cramps may continue for a couple of days. You may have a regular period in 2-6 weeks.  Call your doctor for any of the following:  Excessive vaginal bleeding or clotting, saturating and changing one pad every hour.  Inability to urinate 6 hours after discharge from hospital.  Pain not relieved by pain medication.  Fever of 100.4  F or greater.  Unusual vaginal discharge or odor.  Return to office _________________Call for an appointment ___________________ Patients signature: ______________________ Nurses signature ________________________  Post Anesthesia Care Unit [phone]   Post Anesthesia Home Care Instructions  Activity: Get plenty of rest for the remainder of the day. A responsible individual must stay with you for 24 hours following the procedure.  For the next 24 hours, DO NOT: -Drive a car -Paediatric nurse -Drink alcoholic beverages -Take any medication unless instructed by your physician -Make any legal decisions or sign important papers.  Meals: Start with liquid foods such as gelatin or soup. Progress to regular foods as tolerated. Avoid greasy, spicy, heavy foods. If nausea and/or vomiting occur, drink only clear liquids until the nausea and/or vomiting subsides. Call your physician if vomiting continues.  Special Instructions/Symptoms: Your throat may feel dry or sore from the anesthesia or the breathing tube placed in your throat during surgery. If this causes discomfort, gargle with warm salt water. The discomfort should disappear within 24 hours.  If you had a scopolamine patch placed behind your ear for the management of post- operative nausea and/or vomiting:  1. The medication in the patch is effective for 72 hours, after which it should be removed.  Wrap patch in a tissue and discard in the trash. Wash hands thoroughly with soap and water. 2. You may remove the patch earlier than 72 hours if you experience unpleasant side effects which may include dry  mouth, dizziness or visual disturbances. 3. Avoid touching the patch. Wash your hands with soap and water after contact with the patch.

## 2017-08-18 NOTE — Anesthesia Postprocedure Evaluation (Signed)
Anesthesia Post Note  Patient: Sherry Campos  Procedure(s) Performed: DILATATION & CURETTAGE/HYSTEROSCOPY WITH MYOSURE (N/A Vagina )     Patient location during evaluation: PACU Anesthesia Type: General Level of consciousness: awake and alert Pain management: pain level controlled Vital Signs Assessment: post-procedure vital signs reviewed and stable Respiratory status: spontaneous breathing, nonlabored ventilation, respiratory function stable and patient connected to nasal cannula oxygen Cardiovascular status: blood pressure returned to baseline and stable Postop Assessment: no apparent nausea or vomiting Anesthetic complications: no    Last Vitals:  Vitals:   08/17/17 1500 08/17/17 1558  BP: (!) 145/81 136/79  Pulse: 75 79  Resp: 19 16  Temp: 37 C 36.7 C  SpO2: 98% 100%    Last Pain:  Vitals:   08/17/17 1558  TempSrc:   PainSc: 0-No pain   Pain Goal: Patients Stated Pain Goal: 4 (08/17/17 1558)               Lucus Lambertson S

## 2017-08-19 ENCOUNTER — Encounter (HOSPITAL_COMMUNITY): Payer: Self-pay | Admitting: Obstetrics and Gynecology

## 2017-08-20 NOTE — Op Note (Signed)
NAME:  Sherry Campos, Sherry Campos           ACCOUNT NO.:  MEDICAL RECORD NO.:  [id number]  LOCATION:                                 FACILITY:  PHYSICIAN:  Servando Salina, M.D.    DATE OF BIRTH:  DATE OF PROCEDURE:  08/17/2017 DATE OF DISCHARGE:                              OPERATIVE REPORT   PREOPERATIVE DIAGNOSIS:  Persistent postmenopausal bleeding.  PROCEDURES:  Diagnostic hysteroscopy, hysteroscopic resection of endometrial and endocervical polyps, submucosal fibroid resection, and dilation and curettage.  POSTOPERATIVE DIAGNOSES:  Postmenopausal bleeding, endometrial/endocervical polyps, submucosal fibroid.  ANESTHESIA:  General.  SURGEON:  Servando Salina, M.D.  ASSISTANT:  None.  DESCRIPTION OF PROCEDURE:  Under adequate general anesthesia, the patient was placed in the dorsal lithotomy position.  She was sterilely prepped and draped in usual fashion.  Bladder was catheterized for mild amount of urine.  Examination under anesthesia revealed anteverted uterus.  No adnexal masses could be appreciated.  Bivalve speculum was placed in the vagina.  Single-tooth tenaculum was placed on the anterior lip of the cervix.  The cervix was then serially dilated up to #23 [facility] dilator.  A MyoSure hysteroscope was introduced into the uterine cavity. At that point, both tubal ostia could be seen.  There was appeared to be a submucosal anterior fibroid and smaller polypoid lesions with diffuse areas of thickening in the lower uterine segment.  Using the Reach resectoscope, the submucosal fibroid was resected as well as the endometrial lining and endometrial polyps noted.  On removing the instrument through the canal, a large polypoid endocervical polyp was noted, which was also then removed.  Once the procedure was felt to be completed, all instruments were then removed from the vagina.  SPECIMEN:  Labeled endometrial polyps, fibroid resection, and curetting were sent to  Pathology.  ESTIMATED BLOOD LOSS:  10 mL.  FLUID DEFICIT:  1500 mL.  Sponge and instrument counts x2 were correct.  COMPLICATIONS:  None.  The patient tolerated the procedure well, was transferred to recovery in stable condition.     Servando Salina, M.D.     [place]/MEDQ  D:  08/17/2017  T:  08/18/2017  Job:  [id number]

## 2018-01-02 ENCOUNTER — Other Ambulatory Visit: Payer: Self-pay | Admitting: Obstetrics and Gynecology

## 2018-01-02 DIAGNOSIS — Z139 Encounter for screening, unspecified: Secondary | ICD-10-CM

## 2018-01-28 ENCOUNTER — Ambulatory Visit
Admission: RE | Admit: 2018-01-28 | Discharge: 2018-01-28 | Disposition: A | Discharge status: Home / Self Care | Payer: BC Managed Care – PPO | Source: Ambulatory Visit | Attending: Obstetrics and Gynecology | Admitting: Obstetrics and Gynecology

## 2018-01-28 DIAGNOSIS — Z139 Encounter for screening, unspecified: Secondary | ICD-10-CM

## 2019-04-22 ENCOUNTER — Other Ambulatory Visit: Payer: Self-pay | Admitting: Obstetrics and Gynecology

## 2019-04-22 DIAGNOSIS — Z1231 Encounter for screening mammogram for malignant neoplasm of breast: Secondary | ICD-10-CM

## 2019-05-23 ENCOUNTER — Ambulatory Visit
Admission: RE | Admit: 2019-05-23 | Discharge: 2019-05-23 | Disposition: A | Discharge status: Home / Self Care | Payer: BC Managed Care – PPO | Source: Ambulatory Visit | Attending: Obstetrics and Gynecology | Admitting: Obstetrics and Gynecology

## 2019-05-23 ENCOUNTER — Other Ambulatory Visit: Payer: Self-pay

## 2019-05-23 DIAGNOSIS — Z1231 Encounter for screening mammogram for malignant neoplasm of breast: Secondary | ICD-10-CM

## 2020-05-19 ENCOUNTER — Other Ambulatory Visit: Payer: Self-pay | Admitting: Obstetrics and Gynecology

## 2020-05-19 DIAGNOSIS — Z1231 Encounter for screening mammogram for malignant neoplasm of breast: Secondary | ICD-10-CM

## 2020-05-31 ENCOUNTER — Other Ambulatory Visit: Payer: Self-pay

## 2020-05-31 ENCOUNTER — Ambulatory Visit
Admission: RE | Admit: 2020-05-31 | Discharge: 2020-05-31 | Disposition: A | Discharge status: Home / Self Care | Payer: BC Managed Care – PPO | Source: Ambulatory Visit | Attending: Obstetrics and Gynecology | Admitting: Obstetrics and Gynecology

## 2020-05-31 DIAGNOSIS — Z1231 Encounter for screening mammogram for malignant neoplasm of breast: Secondary | ICD-10-CM

## 2021-06-14 ENCOUNTER — Other Ambulatory Visit: Payer: Self-pay | Admitting: Obstetrics and Gynecology

## 2021-06-14 DIAGNOSIS — Z1231 Encounter for screening mammogram for malignant neoplasm of breast: Secondary | ICD-10-CM

## 2021-07-08 ENCOUNTER — Other Ambulatory Visit: Payer: Self-pay

## 2021-07-08 ENCOUNTER — Ambulatory Visit
Admission: RE | Admit: 2021-07-08 | Discharge: 2021-07-08 | Disposition: A | Discharge status: Home / Self Care | Payer: BC Managed Care – PPO | Source: Ambulatory Visit | Attending: Obstetrics and Gynecology | Admitting: Obstetrics and Gynecology

## 2021-07-08 DIAGNOSIS — Z1231 Encounter for screening mammogram for malignant neoplasm of breast: Secondary | ICD-10-CM

## 2022-01-27 ENCOUNTER — Ambulatory Visit
Admission: RE | Admit: 2022-01-27 | Discharge: 2022-01-27 | Disposition: A | Discharge status: Home / Self Care | Payer: BC Managed Care – PPO | Source: Ambulatory Visit | Attending: Family Medicine | Admitting: Family Medicine

## 2022-01-27 ENCOUNTER — Other Ambulatory Visit: Payer: Self-pay | Admitting: Family Medicine

## 2022-01-27 DIAGNOSIS — M25572 Pain in left ankle and joints of left foot: Secondary | ICD-10-CM

## 2022-02-02 NOTE — Progress Notes (Signed)
Surgical Instructions    Your procedure is scheduled on May 24th Wednesday.  Report to [facility] Main Entrance "A" at 2 PM , then check in with the Admitting office.  Call this number if you have problems the morning of surgery:  [phone]   If you have any questions prior to your surgery date call [phone]: Open Monday-Friday 8am-4pm    Remember:  Do not eat after midnight the night before your surgery  You may drink clear liquids until 1 PM the day of your surgery.   Clear liquids allowed are: Water, Non-Citrus Juices (without pulp), Carbonated Beverages, Clear Tea, Black Coffee ONLY (NO MILK, CREAM OR POWDERED CREAMER of any kind), and Gatorade    Take these medicines the morning of surgery with A SIP OF WATER: busPIRone (BUSPAR) 10 MG tablet carvedilol (COREG) 12.5 MG tablet esomeprazole (NEXIUM) 40 MG capsule fexofenadine (ALLEGRA) 180 MG tablet hydroxychloroquine (PLAQUENIL) 200 MG tablet isosorbide mononitrate (IMDUR) 30 MG 24 hr tablet methocarbamol (ROBAXIN) 500 MG tablet mycophenolate (CELLCEPT) 500 MG tablet triamcinolone (NASACORT) 55 MCG/ACT AERO nasal inhaler   IF NEEDED albuterol (PROVENTIL HFA;VENTOLIN HFA) 108 (90 Base) MCG/ACT inhaler - please bring with you to the hospital    Follow your surgeon's instructions on when to stop Aspirin.  If no instructions were given by your surgeon then you will need to call the office to get those instructions.     As of today, STOP taking any Aspirin (unless otherwise instructed by your surgeon) Mobic, Aleve, Naproxen, Ibuprofen, Motrin, Advil, Goody's, BC's, all herbal medications, fish oil, and all vitamins.           Do not wear jewelry or makeup Do not wear lotions, powders, perfumes or deodorant. Do not shave 48 hours prior to surgery.   Do not bring valuables to the hospital. Do not wear nail polish, gel polish, artificial nails, or any other type of covering on natural nails (fingers and toes) If you  have artificial nails or gel coating that need to be removed by a nail salon, please have this removed prior to surgery. Artificial nails or gel coating may interfere with anesthesia's ability to adequately monitor your vital signs.  [facility] is not responsible for any belongings or valuables. .   Do NOT Smoke (Tobacco/Vaping)  24 hours prior to your procedure  If you use a CPAP at night, you may bring your mask for your overnight stay.   Contacts, glasses, hearing aids, dentures or partials may not be worn into surgery, please bring cases for these belongings   For patients admitted to the hospital, discharge time will be determined by your treatment team.   Patients discharged the day of surgery will not be allowed to drive home, and someone needs to stay with them for 24 hours.   SURGICAL WAITING ROOM VISITATION Patients having surgery or a procedure in a hospital may have two support people. Children under the age of 62 must have an adult with them who is not the patient. They may stay in the waiting area during the procedure and may switch out with other visitors. If the patient needs to stay at the hospital during part of their recovery, the visitor guidelines for inpatient rooms apply.  Please refer to the [facility] website for the visitor guidelines for Inpatients (after your surgery is over and you are in a regular room).       Special instructions:    Oral Hygiene is also important to reduce your  risk of infection.  Remember - BRUSH YOUR TEETH THE MORNING OF SURGERY WITH YOUR REGULAR TOOTHPASTE   [place]- Preparing For Surgery  Before surgery, you can play an important role. Because skin is not sterile, your skin needs to be as free of germs as possible. You can reduce the number of germs on your skin by washing with CHG (chlorahexidine gluconate) Soap before surgery.  CHG is an antiseptic cleaner which kills germs and bonds with the skin to continue killing germs  even after washing.     Please do not use if you have an allergy to CHG or antibacterial soaps. If your skin becomes reddened/irritated stop using the CHG.  Do not shave (including legs and underarms) for at least 48 hours prior to first CHG shower. It is OK to shave your face.  Please follow these instructions carefully.     Shower the NIGHT BEFORE SURGERY and the MORNING OF SURGERY with CHG Soap.   If you chose to wash your hair, wash your hair first as usual with your normal shampoo. After you shampoo, rinse your hair and body thoroughly to remove the shampoo.  Then [employer] and genitals (private parts) with your normal soap and rinse thoroughly to remove soap.  After that Use CHG Soap as you would any other liquid soap. You can apply CHG directly to the skin and wash gently with a scrungie or a clean washcloth.   Apply the CHG Soap to your body ONLY FROM THE NECK DOWN.  Do not use on open wounds or open sores. Avoid contact with your eyes, ears, mouth and genitals (private parts). Wash Face and genitals (private parts)  with your normal soap.   Wash thoroughly, paying special attention to the area where your surgery will be performed.  Thoroughly rinse your body with warm water from the neck down.  DO NOT shower/wash with your normal soap after using and rinsing off the CHG Soap.  Pat yourself dry with a CLEAN TOWEL.  Wear CLEAN PAJAMAS to bed the night before surgery  Place CLEAN SHEETS on your bed the night before your surgery  DO NOT SLEEP WITH PETS.   Day of Surgery:  Take a shower with CHG soap. Wear Clean/Comfortable clothing the morning of surgery Do not apply any deodorants/lotions.   Remember to brush your teeth WITH YOUR REGULAR TOOTHPASTE.    If you received a COVID test during your pre-op visit, it is requested that you wear a mask when out in public, stay away from anyone that may not be feeling well, and notify your surgeon if you develop symptoms. If you  have been in contact with anyone that has tested positive in the last 10 days, please notify your surgeon.    Please read over the following fact sheets that you were given.

## 2022-02-03 ENCOUNTER — Other Ambulatory Visit: Payer: Self-pay

## 2022-02-03 ENCOUNTER — Encounter (HOSPITAL_COMMUNITY): Payer: Self-pay | Admitting: Physician Assistant

## 2022-02-03 ENCOUNTER — Encounter (HOSPITAL_COMMUNITY)
Admission: RE | Admit: 2022-02-03 | Discharge: 2022-02-03 | Disposition: A | Discharge status: Home / Self Care | Payer: BC Managed Care – PPO | Source: Ambulatory Visit | Attending: Orthopaedic Surgery | Admitting: Orthopaedic Surgery

## 2022-02-03 ENCOUNTER — Encounter (HOSPITAL_COMMUNITY): Payer: Self-pay

## 2022-02-03 VITALS — BP 141/96 | HR 78 | Temp 98.0°F | Resp 18 | Ht 65.5 in | Wt 206.0 lb

## 2022-02-03 DIAGNOSIS — Z01818 Encounter for other preprocedural examination: Secondary | ICD-10-CM | POA: Diagnosis present

## 2022-02-03 HISTORY — DX: Depression, unspecified: F32.A

## 2022-02-03 HISTORY — DX: Anxiety disorder, unspecified: F41.9

## 2022-02-03 LAB — SURGICAL PCR SCREEN
MRSA, PCR: NEGATIVE
Staphylococcus aureus: NEGATIVE

## 2022-02-03 NOTE — Progress Notes (Signed)
PCP - Dr. Doug Sou Cardiologist - denies    PPM/ICD - denies   Chest x-ray - 06/16/16 EKG - 02/03/22 at PAT Stress Test - denies ECHO - denies Cardiac Cath - denies  Sleep Study - denies  DM- denies  Blood Thinner Instructions: n/a Aspirin Instructions: pt instructed to f/u with surgeon for instructions Pt instructed by Rheumatologist to continue Plaquenil thru day of surgery. Hold mycophenolate for 7 days prior to surgery   ERAS Protocol - yes, no drink   COVID TEST- n/a   Anesthesia review: no  Patient denies shortness of breath, fever, cough and chest pain at PAT appointment   All instructions explained to the patient, with a verbal understanding of the material. Patient agrees to go over the instructions while at home for a better understanding. Patient also instructed to notify surgeon of any contact with COVID+ person or if she develops any symptoms. The opportunity to ask questions was provided.

## 2022-02-08 ENCOUNTER — Encounter (HOSPITAL_COMMUNITY): Admission: RE | Payer: Self-pay | Source: Home / Self Care

## 2022-02-08 ENCOUNTER — Ambulatory Visit (HOSPITAL_COMMUNITY)
Admission: RE | Admit: 2022-02-08 | Payer: BC Managed Care – PPO | Source: Home / Self Care | Admitting: Orthopaedic Surgery

## 2022-02-08 SURGERY — OPEN REDUCTION INTERNAL FIXATION (ORIF) ANKLE FRACTURE
Anesthesia: Choice | Site: Ankle | Laterality: Left

## 2022-07-11 ENCOUNTER — Other Ambulatory Visit: Payer: Self-pay | Admitting: Obstetrics and Gynecology

## 2022-07-11 DIAGNOSIS — Z1231 Encounter for screening mammogram for malignant neoplasm of breast: Secondary | ICD-10-CM

## 2022-09-04 ENCOUNTER — Ambulatory Visit: Payer: BC Managed Care – PPO

## 2022-09-07 ENCOUNTER — Ambulatory Visit
Admission: RE | Admit: 2022-09-07 | Discharge: 2022-09-07 | Disposition: A | Discharge status: Home / Self Care | Payer: BC Managed Care – PPO | Source: Ambulatory Visit | Attending: Obstetrics and Gynecology | Admitting: Obstetrics and Gynecology

## 2022-09-07 DIAGNOSIS — Z1231 Encounter for screening mammogram for malignant neoplasm of breast: Secondary | ICD-10-CM

## 2023-01-23 ENCOUNTER — Encounter (HOSPITAL_BASED_OUTPATIENT_CLINIC_OR_DEPARTMENT_OTHER): Payer: Self-pay

## 2023-02-07 ENCOUNTER — Institutional Professional Consult (permissible substitution) (HOSPITAL_BASED_OUTPATIENT_CLINIC_OR_DEPARTMENT_OTHER): Payer: BC Managed Care – PPO | Admitting: Cardiovascular Disease

## 2023-02-07 NOTE — Progress Notes (Deleted)
[facility] Initial Assessment:    Date:  02/07/2023   ID:  Sherry Campos, DOB 12/18/1970, MRN [id number]  PCP:  Leola Brazil, DO  Cardiologist:  None  Nephrologist:  Referring MD: Maxie Better, MD   CC: Hypertension  History of Present Illness:    Sherry Campos is a 52 y.o. female with a hx of hypertension here to establish care in the [facility].  She saw Dr. Cherly Hensen 11/2022.  At that appointment her blood pressure was 145/84 on losartan/HCTZ, carvedilol, and Imdur.  Previous antihypertensives:  Secondary Causes of Hypertension  Medications/Herbal: OCP, steroids, stimulants, antidepressants, weight loss medication, immune suppressants, NSAIDs, sympathomimetics, alcohol, caffeine, licorice, ginseng, St. John's wort, chemo  Sleep Apnea Renal artery stenosis Hyperaldosteronism Hyper/hypothyroidism Pheochromocytoma: palpitations, tachycardia, headache, diaphoresis (plasma metanephrines) Cushing's syndrome: Cushingoid facies, central obesity, proximal muscle weakness, and ecchymoses, adrenal incidentaloma (cortisol) Coarctation of the aorta  Past Medical History:  Diagnosis Date   Anemia    Anxiety    Breast nodule    Breast tenderness    BV (bacterial vaginosis)    Constipation    Contact lens/glasses fitting    wears contacts or glasses   Depression    Dysuria    Endometrial polyp    Endometriosis    Fibroids    GERD (gastroesophageal reflux disease)    Hematuria    Hypertension    Interstitial lung disease (HCC)    early signs related to Lupus   Irregular menstrual bleeding    Lupus (HCC)    Menorrhagia    Mitral valve prolapse    25 years ago   Mixed connective tissue disease (HCC)    Lupus   Pelvic pain in female    Pneumonia 2017   Post - coital bleeding    Raynaud disease    Rectal bleeding    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   Simple ovarian cyst    SLE (systemic lupus  erythematosus) (HCC) 2012   UTI (urinary tract infection)     Past Surgical History:  Procedure Laterality Date   BREAST BIOPSY Right    2015   CESAREAN SECTION     DIAGNOSTIC LAPAROSCOPY  2003   DILATATION & CURETTAGE/HYSTEROSCOPY WITH MYOSURE N/A 08/17/2017   Procedure: DILATATION & CURETTAGE/HYSTEROSCOPY WITH MYOSURE;  Surgeon: Maxie Better, MD;  Location: [facility];  Service: Gynecology;  Laterality: N/A;   EYE SURGERY  1976   FOOT BONE EXCISION     lt spurs   STRABISMUS SURGERY     age 37   TONSILECTOMY, ADENOIDECTOMY, BILATERAL MYRINGOTOMY AND TUBES      Current Medications: No outpatient medications have been marked as taking for the 02/07/23 encounter (Appointment) with Chilton Si, MD.     Allergies:   Ace inhibitors, Amlodipine, Hydrochlorothiazide, Amiloride, Pecan extract, Tramadol hcl, Vaccinium angustifolium, and Sulfa antibiotics   Social History   Socioeconomic History   Marital status: Divorced    Spouse name: Not on file   Number of children: 1   Years of education: Not on file   Highest education level: Not on file  Occupational History   Not on file  Tobacco Use   Smoking status: Never   Smokeless tobacco: Never  Vaping Use   Vaping Use: Never used  Substance and Sexual Activity   Alcohol use: Yes    Comment: Maybe one glass of wine once a week   Drug use: No   Sexual activity: Yes  Birth control/protection: Other-see comments    Comment: husband vasectomy  Other Topics Concern   Not on file  Social History Narrative   Not on file   Social Determinants of Health   Financial Resource Strain: Not on file  Food Insecurity: Not on file  Transportation Needs: Not on file  Physical Activity: Not on file  Stress: Not on file  Social Connections: Not on file     Family History: The patient's ***family history includes Breast cancer (age of onset: 48) in her maternal aunt; Breast cancer (age of onset: 39) in her mother; Cancer in  her maternal aunt, maternal uncle, mother, and paternal uncle; Diabetes in her father and mother; Heart attack in her father; Hypertension in her father and mother; Stroke in her mother.  ROS:   Please see the history of present illness.    *** All other systems reviewed and are negative.  EKGs/Labs/Other Studies Reviewed:    EKG:  EKG is *** ordered today.  The ekg ordered today demonstrates ***  Recent Labs: No results found for requested labs within last 365 days.   Recent Lipid Panel No results found for: "CHOL", "TRIG", "HDL", "CHOLHDL", "VLDL", "LDLCALC", "LDLDIRECT"  Physical Exam:   VS:  LMP 11/19/2012  , BMI There is no height or weight on file to calculate BMI. GENERAL:  Well appearing HEENT: Pupils equal round and reactive, fundi not visualized, oral mucosa unremarkable NECK:  No jugular venous distention, waveform within normal limits, carotid upstroke brisk and symmetric, no bruits, no thyromegaly LYMPHATICS:  No cervical adenopathy LUNGS:  Clear to auscultation bilaterally HEART:  RRR.  PMI not displaced or sustained,S1 and S2 within normal limits, no S3, no S4, no clicks, no rubs, *** murmurs ABD:  Flat, positive bowel sounds normal in frequency in pitch, no bruits, no rebound, no guarding, no midline pulsatile mass, no hepatomegaly, no splenomegaly EXT:  2 plus pulses throughout, no edema, no cyanosis no clubbing SKIN:  No rashes no nodules NEURO:  Cranial nerves II through XII grossly intact, motor grossly intact throughout PSYCH:  Cognitively intact, oriented to person place and time   ASSESSMENT/PLAN:    No problem-specific Assessment & Plan notes found for this encounter.   Screening for Secondary Hypertension: { Click here to document screening for secondary causes of HTN  :[id number]}    Relevant Labs/Studies:    Latest Ref Rng & Units 08/08/2017   10:30 AM 07/26/2007   12:35 AM  Basic Labs  Sodium 135 - 145 mmol/L 138  135   Potassium 3.5 - 5.1  mmol/L 4.0  3.9   Creatinine 0.44 - 1.00 mg/dL 4.09  8.11       Disposition:    FU with MD/PharmD in {gen number [id number]:[id number]} {Days to years:10300}    Medication Adjustments/Labs and Tests Ordered: Current medicines are reviewed at length with the patient today.  Concerns regarding medicines are outlined above.  No orders of the defined types were placed in this encounter.  No orders of the defined types were placed in this encounter.    Signed, Chilton Si, MD  02/07/2023 1:33 PM    [facility]

## 2023-04-12 ENCOUNTER — Institutional Professional Consult (permissible substitution) (HOSPITAL_BASED_OUTPATIENT_CLINIC_OR_DEPARTMENT_OTHER): Payer: BC Managed Care – PPO | Admitting: Family

## 2023-06-20 ENCOUNTER — Institutional Professional Consult (permissible substitution) (HOSPITAL_BASED_OUTPATIENT_CLINIC_OR_DEPARTMENT_OTHER): Payer: BC Managed Care – PPO | Admitting: Cardiovascular Disease

## 2023-07-05 ENCOUNTER — Encounter (HOSPITAL_BASED_OUTPATIENT_CLINIC_OR_DEPARTMENT_OTHER): Payer: Self-pay

## 2023-10-22 IMAGING — CT CT ANKLE*L* W/O CM
1 series · 12 of 14 positions shown, 15 images · non-contrast
Comparison: None Available.

CLINICAL DATA: Evaluate ankle fracture.

EXAM:
CT OF THE LEFT ANKLE WITHOUT CONTRAST
TECHNIQUE: Multidetector CT imaging of the left ankle was performed according
to the standard protocol. Multiplanar CT image reconstructions were
also generated.
RADIATION DOSE REDUCTION: This exam was performed according to the
departmental dose-optimization program which includes automated
exposure control, adjustment of the mA and/or kV according to
patient size and/or use of iterative reconstruction technique.

[Series 4: soft tissue lower extremity · axial · 0.37mm/px · z∈[-244,-50]mm · 12 of 115 slices shown, 15 images]
[im 9/115  soft-tissue]
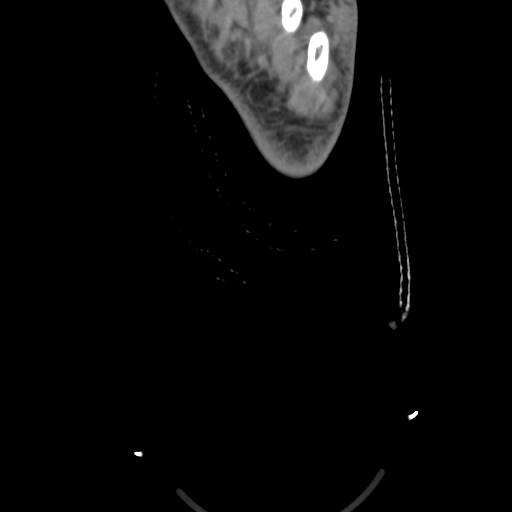
[im 9/115  bone]
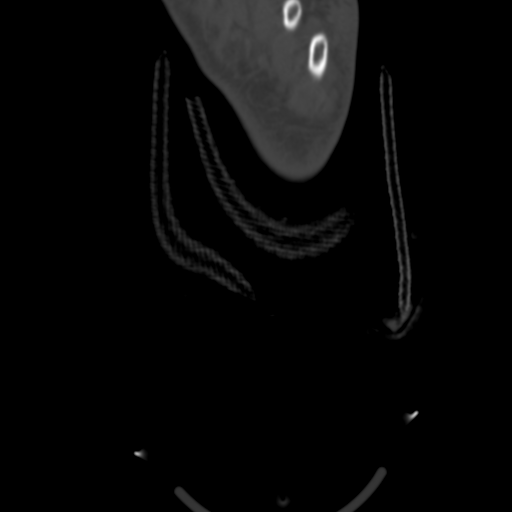
[im 18/115  bone]
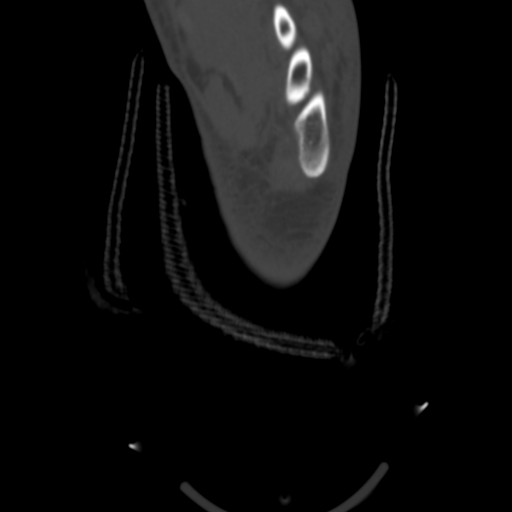
[im 27/115  bone]
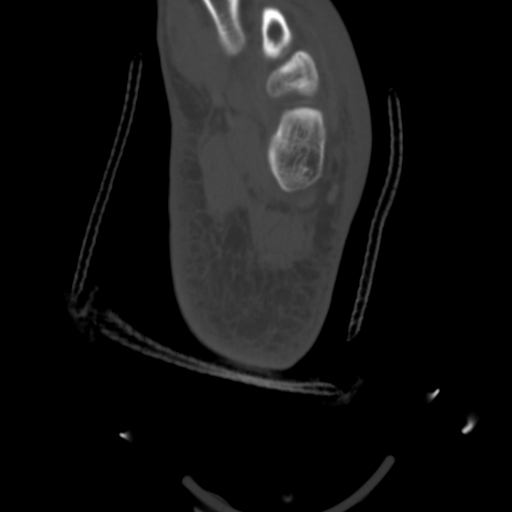
[im 36/115  bone]
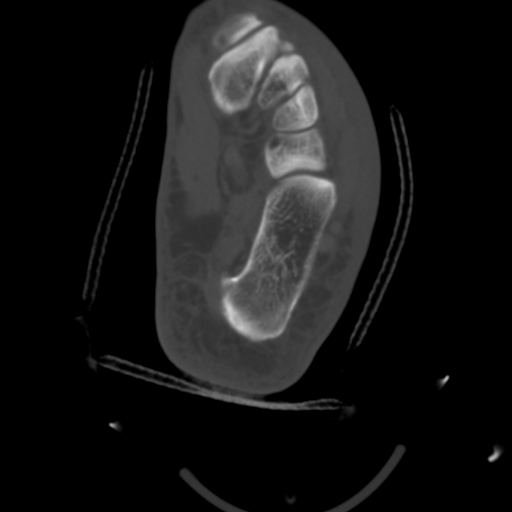
[im 44/115  soft-tissue]
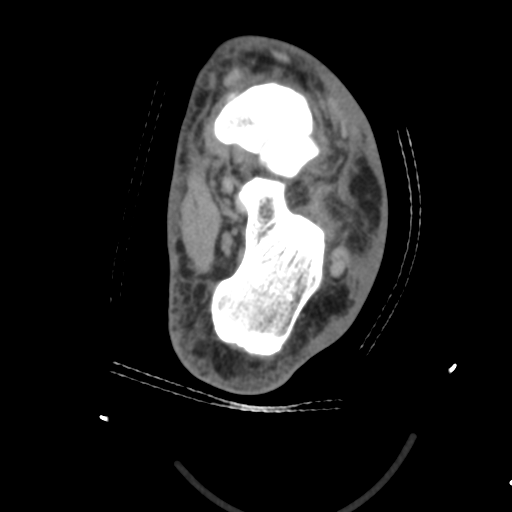
[im 44/115  bone]
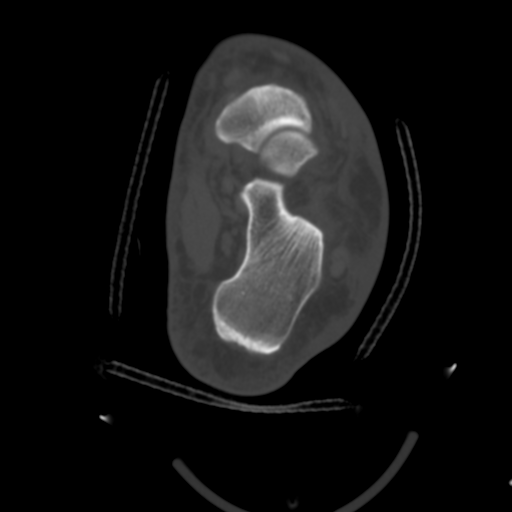
[im 53/115  bone]
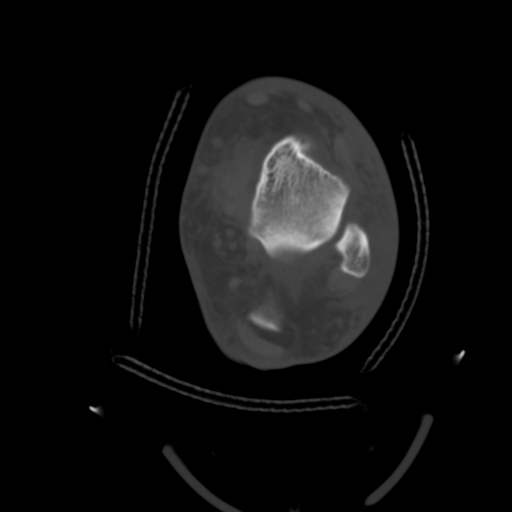
[im 62/115  bone]
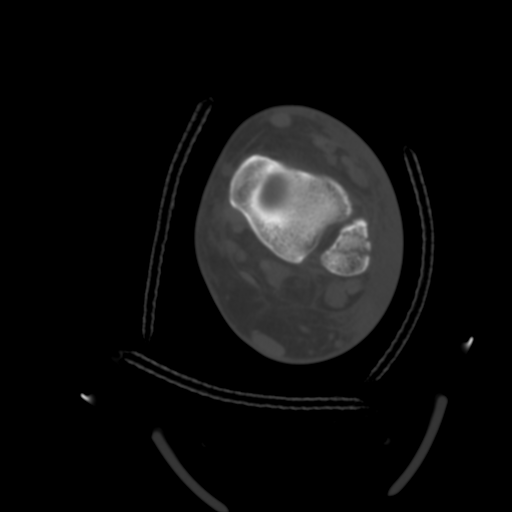
[im 71/115  bone]
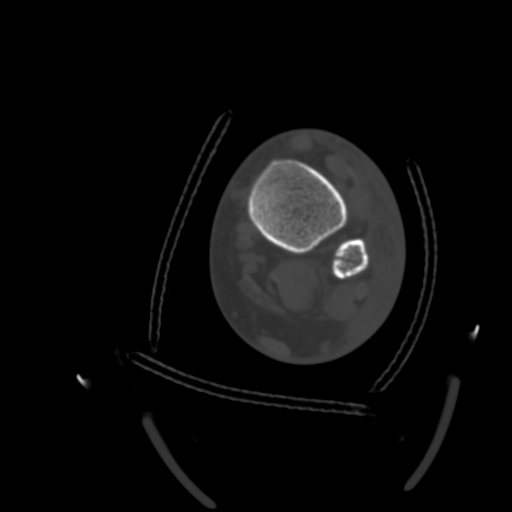
[im 79/115  soft-tissue]
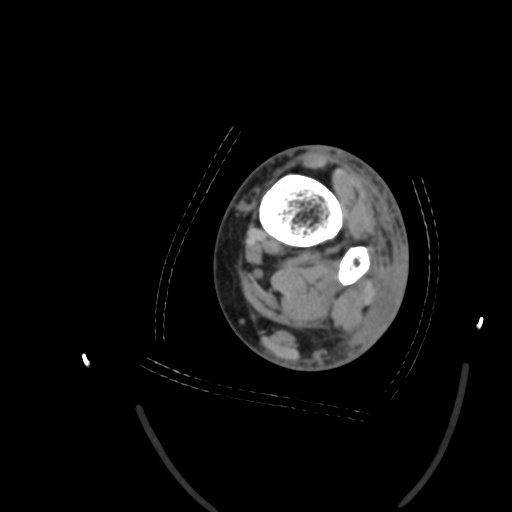
[im 79/115  bone]
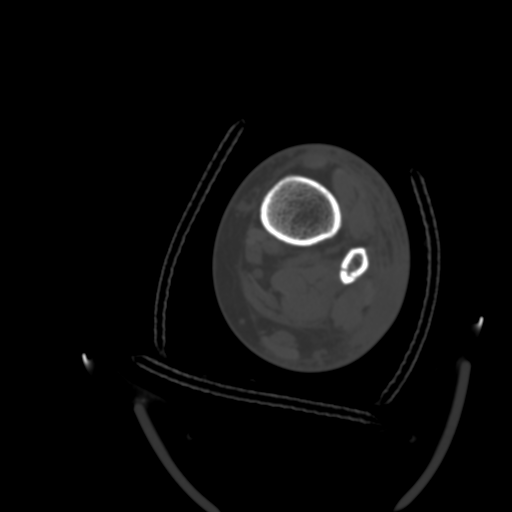
[im 88/115  bone]
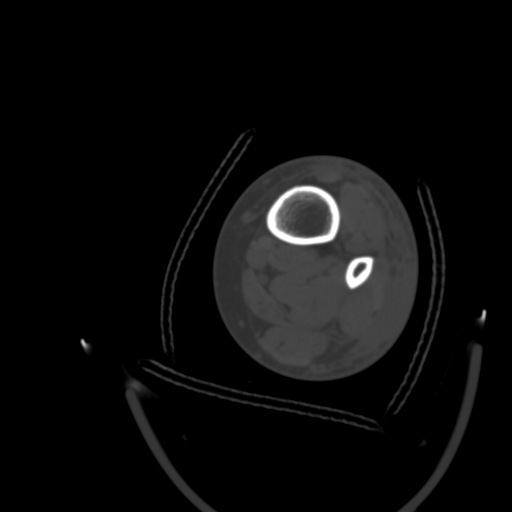
[im 97/115  bone]
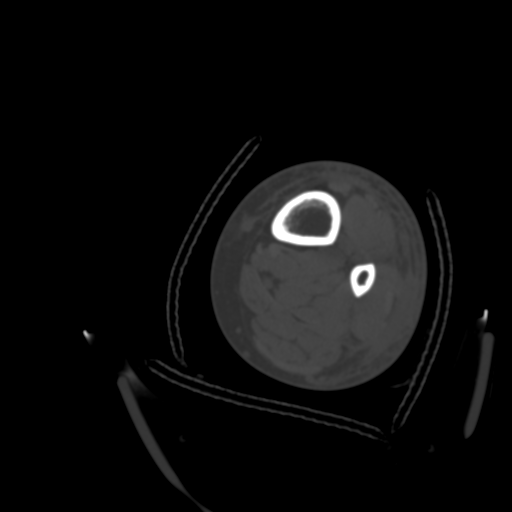
[im 106/115  bone]
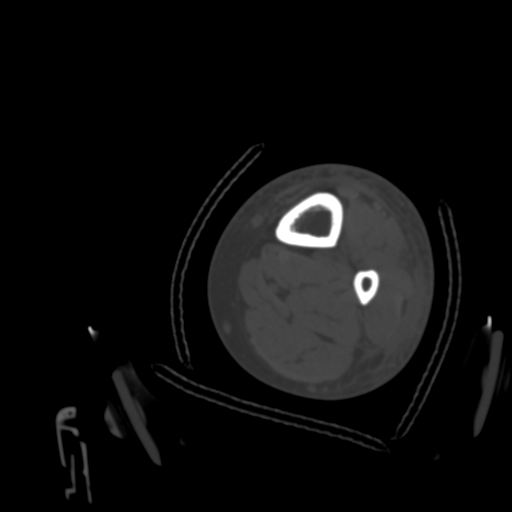

[12 of 14 positions shown; findings below may reference images not displayed]

FINDINGS: Complex comminuted but relatively nondisplaced spiral type fracture
of the distal fibula located at and above the level of the ankle
mortise. No tibial fracture is identified. The talus is intact. The
ankle mortise is maintained.

The visualized mid and hindfoot bony structures are intact. Normal
appearance of the sinus tarsi. There is fairly significant hindfoot
varus noted.

Associated diffuse subcutaneous soft tissue
swelling/edema/hemorrhage. Grossly the medial, lateral and anterior
ankle tendons are intact. The Achilles tendon is normal.
IMPRESSION: 1. Complex comminuted but relatively nondisplaced spiral type
fracture of the distal fibula located at and above the level of the
ankle mortise.
2. No tibial fracture is identified.
3. Hindfoot varus.

## 2024-01-21 ENCOUNTER — Other Ambulatory Visit: Payer: Self-pay | Admitting: Obstetrics and Gynecology

## 2024-01-21 DIAGNOSIS — Z1231 Encounter for screening mammogram for malignant neoplasm of breast: Secondary | ICD-10-CM

## 2024-01-24 ENCOUNTER — Other Ambulatory Visit: Payer: Self-pay | Admitting: Nurse Practitioner

## 2024-01-24 DIAGNOSIS — N6320 Unspecified lump in the left breast, unspecified quadrant: Secondary | ICD-10-CM

## 2024-02-06 ENCOUNTER — Other Ambulatory Visit: Payer: Self-pay | Admitting: Nurse Practitioner

## 2024-02-06 DIAGNOSIS — R2232 Localized swelling, mass and lump, left upper limb: Secondary | ICD-10-CM

## 2024-02-12 ENCOUNTER — Ambulatory Visit
Admission: RE | Admit: 2024-02-12 | Discharge: 2024-02-12 | Disposition: A | Discharge status: Home / Self Care | Payer: Self-pay | Source: Ambulatory Visit | Attending: Nurse Practitioner | Admitting: Nurse Practitioner

## 2024-02-12 DIAGNOSIS — R2232 Localized swelling, mass and lump, left upper limb: Secondary | ICD-10-CM
